# Patient Record
Sex: Female | Born: 1990 | Race: Black or African American | Hispanic: No | Smoking: Never smoker
Health system: Southern US, Community
[De-identification: ages and names within clinical notes are randomized; demographics above are authoritative.]

## PROBLEM LIST (undated history)

## (undated) DIAGNOSIS — D649 Anemia, unspecified: Secondary | ICD-10-CM

## (undated) DIAGNOSIS — N920 Excessive and frequent menstruation with regular cycle: Secondary | ICD-10-CM

## (undated) DIAGNOSIS — O020 Blighted ovum and nonhydatidiform mole: Secondary | ICD-10-CM

## (undated) DIAGNOSIS — E010 Iodine-deficiency related diffuse (endemic) goiter: Secondary | ICD-10-CM

## (undated) HISTORY — DX: Blighted ovum and nonhydatidiform mole: O02.0

## (undated) HISTORY — DX: Excessive and frequent menstruation with regular cycle: N92.0

## (undated) HISTORY — DX: Iodine-deficiency related diffuse (endemic) goiter: E01.0

---
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
## 2010-06-10 NOTE — Op Note (Signed)
Sharon Pratt, Sharon Pratt                 ACCOUNT NO.:  [id number]   MEDICAL RECORD NO.:  [id number]          PATIENT TYPE:  INP   LOCATION:  9372                          [facility]:  [facility]   PHYSICIAN:  Hal Morales, M.D.DATE OF BIRTH:  03-Feb-1990   DATE OF PROCEDURE:  10/22/2006  DATE OF DISCHARGE:                               OPERATIVE REPORT   PREOPERATIVE DIAGNOSES:  1. Intrauterine pregnancy at 78 weeks' gestation.  2. Severe preeclampsia with liver enzyme abnormalities.   POSTOPERATIVE DIAGNOSES:  1. Intrauterine pregnancy at 70 weeks' gestation.  2. Severe preeclampsia with liver enzyme abnormalities.   OPERATION:  Primary low transverse cesarean section.   SURGEON:  Dr. Dierdre Forth.   FIRST ASSISTANT:  Wynelle Bourgeois, certified nurse midwife.   ANESTHESIA:  Spinal.   ESTIMATED BLOOD LOSS:  750 mL.   COMPLICATIONS:  None.   FINDINGS:  The uterus, tubes and ovaries were normal for the gravid  state.  The placenta contained an eccentrically inserted three-vessel  cord.  The placenta sheared off the uterus very easily with the question  of abruption.  The patient was delivered of a female infant whose name  is Jerrel Ivory weighing 795 grams with Apgars of 5 and 7 at one and five  minutes, respectively.  The arterial pH was 7.27.   PREOPERATIVE DISCUSSION:  The patient began having increasingly severe  right flank pain over the evening.  This was initially noted at  approximately 5:00 p.m. at which time blood pressures were in the  150s/90 range.  Laboratory studies performed as that time showed a  platelet count of 364,000 and SGOT of 59 and SGPT of 72.  These were  slightly increased from earlier in the day.  At approximately 11:30  p.m., the patient began having more severe right flank pain and did not  get adequate relief from pain medication.  Repeat laboratory studies  included a platelet count of 277,000, LDH of 610, SGOT of 306 and SGPT  of 283.  On  examination, the patient was noted to have right upper  quadrant tenderness.  The fetal heart rate was stable with occasional  decelerations.  A discussion was held with the parents concerning the  rapid deterioration of the mother's liver enzymes moving towards a  possible diagnosis of HELLP syndrome, and the recommendation was made  for immediate cesarean section.  A discussion was held concerning the  risks of anesthesia, bleeding, infection, damage to adjacent organs and  other complications that could not be anticipated versus the benefits of  immediate delivery for the infant with the expectation of improvement in  the mother's preeclampsia.  They consented to proceed.   PROCEDURE:  The patient was taken to the operating room after  appropriate identification and placed on the operating table.  After  placement of a spinal anesthetic, she was placed in the supine position  with a left lateral tilt.  The abdomen and perineum were prepped with  multiple layers of Betadine and a Foley catheter inserted into the  bladder and connected to straight drainage.  The abdomen was  draped as a  sterile field.  After assurance of adequate anesthesia, a transverse  incision was made in the abdomen and the abdomen opened in layers.  The  peritoneum was entered and the bladder blade placed.  The uterus was  incised approximately 2 cm above the uterovesical fold and that incision  taken laterally on either side bluntly.  The membranes were ruptured  with the egress of clear fluid.  The infant was delivered from the  occiput transverse position and, after having the nares and pharynx  suctioned, cord clamped and cut, was handed off to the awaiting  pediatricians.  The placenta was noted to separate very easily, raising  the concern for partial abruption.  The uterine cavity was cleared of  products of conception and the uterine incision closed with a running  interlocking suture of 0 Vicryl.  An  imbricating suture of 0 Vicryl was  then placed and the bladder flap repaired with a running suture of 2-0  Vicryl.  Copious irrigation was carried out and hemostasis noted to be  adequate.  The abdominal peritoneum was closed with a running suture of  2-0 Vicryl.  The rectus muscles were reapproximated in midline with a  figure-of-eight suture of 2-0 Vicryl.  The rectus fascia was closed with  running suture of 0 Vicryl and then reinforced on either side of midline  with figure-of-eight sutures of 0 Vicryl.  The subcutaneous tissue was  made hemostatic with Bovie cautery and irrigated.  A subcuticular suture  of 3-0 Monocryl was then placed and Steri-Strips placed across the  incision.  A sterile dressing was applied and the patient taken from the  operating room to the recovery room in satisfactory condition, having  tolerated the procedure well with sponge and instrument counts correct.  The infant went to the neonatal intensive care unit and the placenta  went to pathology.      Hal Morales, M.D.  Electronically Signed     VPH/MEDQ  D:  10/22/2006  T:  10/22/2006  Job:  [id number]

## 2010-06-13 NOTE — Discharge Summary (Signed)
NAMEHERMAN, Sharon Pratt                 ACCOUNT NO.:  [id number]   MEDICAL RECORD NO.:  [id number]          PATIENT TYPE:  INP   LOCATION:  9372                          [facility]   PHYSICIAN:  Janine Limbo, M.D.DATE OF BIRTH:  11-17-1990   DATE OF ADMISSION:  10/19/2006  DATE OF DISCHARGE:  10/25/2006                               DISCHARGE SUMMARY   ADMISSION DIAGNOSES:  1. Intrauterine pregnancy at 27-1/7 weeks.  2. Severe preeclampsia.   DISCHARGE DIAGNOSES:  1. Intrauterine pregnancy at 27 weeks.  2. Severe preeclampsia with liver enzyme abnormalities  3. Neonatal Intensive Care Unit infant.   PROCEDURES:  1. Magnesium sulfate therapy.  2. Betamethasone therapy.  3. Primary low transverse cesarean section.  4. Spinal anesthesia.   HOSPITAL COURSE:  [name] is an 20 year old gravida 1, para 0 at 27-1/7  weeks who was admitted on October 19, 2006 secondary to elevated blood  pressure and 3+ protein in her urine as well as 24-hour urine showing  9954 mg of protein.  Pregnancy had been remarkable for (1) first  trimester spotting, (2) first trimester Chlamydia.  At the time of  admission blood pressures were in the 130-170s/70s-90s.  Her SGOT and  SGPT were 71 and 77 respectively.  Her platelet count was within normal  limits.  Her BUN and creatinine were essentially normal.  She was placed  on magnesium sulfate.  She had an estimated fetal weight at the 59th  percentile of 2 pounds and normal fluid.  She denied any other symptoms.  The decision was made to proceed with betamethasone therapy.  Maternal  fetal medicine was consulted and the decision was made to proceed with  delivery should her liver function tests increase from that point.  Liver functions were repeated on September 24 with values stable,  pressures remained elevated.  Betamethasone course was completed on  September 26.  Early in the morning she was having some right flank  pain.  Blood pressures  were 150s-180s/90-108 despite IV labetalol.  SGOT  was greater than 300, SGPT was greater than 200.  LDH was greater than  600.  Fetal heart rate was stable with occasional decelerations.  In  light of the patient's deterioration, the patient was consented for  cesarean section.  She was taken to the operating room where a primary  low transverse cesarean section was performed by Dr. Pennie Rushing under  spinal anesthesia.  Findings were a viable female by the name of  Sharon Pratt, weight 795 grams, Apgars were 5 and 7.  Cord pH was 7.27.  The infant was taken to the NICU.  The patient was taken to recovery and  then to [facility] for recovery.  By postop day #1 her pressures were 142-  171/85-105, SGOT and SGPT were 147 and 206, which were down from 292 and  242 respectively.  Her hemoglobin was 11.3, platelet count was 230,000.  Magnesium sulfate was continued until September 28.  Labs were evaluated  again on the morning of September 28 showing SGOT of 59, SGPT of 119,  hemoglobin was 9.1 and  platelet count was 229.  Magnesium sulfate was  discontinued.  Her pressures were still 140s-160s/80-105.  By day 3  postop pressures were still 140s-160s/90-108.  The patient had lost 7  pounds.  Her output was excellent.  Her physical exam was within normal  limits.  The decision was made to discharge her home in light of her  improving preeclampsia.  She was deemed to have received full benefit of  her hospital stay and was discharged home by Dr. Stefano Gaul.   DISCHARGE INSTRUCTIONS:  Per [facility] handout.  PIH  precautions were also reviewed with the patient.   DISCHARGE MEDICATIONS:  1. Labetalol 100 mg p.o. b.i.d. was given.  2. Tylox 1-2 p.o. q.3-4 h p.r.n. pain.  3. Motrin 600 mg 1 p.o. q.6 h p.r.n. pain.   DISCHARGE FOLLOW-UP:  Was going to occur on the following Thursday for  blood pressure check by [employer] Nurse and then she was to return to  the office in 2 weeks for blood pressure  examination, and then to follow  p.r.n. based upon those findings.      Sharon Pratt, C.N.M.      Janine Limbo, M.D.  Electronically Signed    VLL/MEDQ  D:  11/16/2006  T:  11/17/2006  Job:  [id number]

## 2010-11-06 LAB — CBC
HCT: 26.8 — ABNORMAL LOW
HCT: 33.6 — ABNORMAL LOW
HCT: 34 — ABNORMAL LOW
HCT: 34.5 — ABNORMAL LOW
HCT: 34.6 — ABNORMAL LOW
HCT: 36.3
HCT: 38.2
Hemoglobin: 10.9 — ABNORMAL LOW
Hemoglobin: 11.3 — ABNORMAL LOW
Hemoglobin: 11.4 — ABNORMAL LOW
Hemoglobin: 11.9 — ABNORMAL LOW
Hemoglobin: 12.3
Hemoglobin: 12.9
Hemoglobin: 9.1 — ABNORMAL LOW
MCHC: 33.3
MCHC: 33.5
MCHC: 33.5
MCHC: 33.8
MCHC: 33.9
MCV: 82.5
MCV: 83.4
Platelets: 277
Platelets: 308
Platelets: 318
Platelets: 342 — ABNORMAL HIGH
RBC: 3.88
RBC: 4.15
RBC: 4.21
RBC: 4.34
RBC: 4.63
RDW: 14.1 — ABNORMAL HIGH
RDW: 14.2 — ABNORMAL HIGH
RDW: 14.2 — ABNORMAL HIGH
RDW: 14.2 — ABNORMAL HIGH
RDW: 14.5 — ABNORMAL HIGH
RDW: 14.6 — ABNORMAL HIGH
WBC: 11.5 — ABNORMAL HIGH
WBC: 16.9 — ABNORMAL HIGH
WBC: 24.6 — ABNORMAL HIGH
WBC: 29.5 — ABNORMAL HIGH
WBC: 9.5

## 2010-11-06 LAB — COMPREHENSIVE METABOLIC PANEL
ALT: 242 — ABNORMAL HIGH
ALT: 58 — ABNORMAL HIGH
ALT: 70 — ABNORMAL HIGH
ALT: 72 — ABNORMAL HIGH
AST: 44 — ABNORMAL HIGH
AST: 51 — ABNORMAL HIGH
AST: 65 — ABNORMAL HIGH
AST: 71 — ABNORMAL HIGH
Albumin: 1.5 — ABNORMAL LOW
Albumin: 2 — ABNORMAL LOW
Albumin: 2.1 — ABNORMAL LOW
Albumin: 2.7 — ABNORMAL LOW
Alkaline Phosphatase: 70
Alkaline Phosphatase: 79
Alkaline Phosphatase: 80
Alkaline Phosphatase: 81
Alkaline Phosphatase: 82
Alkaline Phosphatase: 96
Alkaline Phosphatase: 98
BUN: 10
BUN: 11
BUN: 7
BUN: 7
BUN: 8
BUN: 9
CO2: 22
CO2: 22
CO2: 23
Calcium: 7 — ABNORMAL LOW
Calcium: 7.1 — ABNORMAL LOW
Calcium: 7.2 — ABNORMAL LOW
Calcium: 7.9 — ABNORMAL LOW
Chloride: 104
Chloride: 104
Chloride: 106
Chloride: 109
Creatinine, Ser: 0.71
Creatinine, Ser: 0.73
Creatinine, Ser: 0.73
GFR calc Af Amer: >60
GFR calc Af Amer: >60
GFR calc Af Amer: >60
GFR calc non Af Amer: >60
GFR calc non Af Amer: >60
GFR calc non Af Amer: >60
Glucose, Bld: 106 — ABNORMAL HIGH
Glucose, Bld: 107 — ABNORMAL HIGH
Glucose, Bld: 113 — ABNORMAL HIGH
Glucose, Bld: 124 — ABNORMAL HIGH
Glucose, Bld: 133 — ABNORMAL HIGH
Glucose, Bld: 89
Potassium: 3.8
Potassium: 4
Potassium: 4.1
Potassium: 4.1
Potassium: 4.2
Sodium: 134 — ABNORMAL LOW
Sodium: 134 — ABNORMAL LOW
Sodium: 136
Sodium: 137
Total Bilirubin: 0.3
Total Bilirubin: 0.6
Total Bilirubin: 0.6
Total Protein: 4.2 — ABNORMAL LOW
Total Protein: 4.5 — ABNORMAL LOW
Total Protein: 4.7 — ABNORMAL LOW
Total Protein: 4.9 — ABNORMAL LOW
Total Protein: 5 — ABNORMAL LOW
Total Protein: 6

## 2010-11-06 LAB — MAGNESIUM
Magnesium: 4.4 — ABNORMAL HIGH
Magnesium: 4.5 — ABNORMAL HIGH
Magnesium: 5 — ABNORMAL HIGH

## 2010-11-06 LAB — TYPE AND SCREEN
ABO/RH(D): B POS
Antibody Screen: NEGATIVE

## 2010-11-06 LAB — ANA: Anti Nuclear Antibody(ANA): NEGATIVE

## 2010-11-06 LAB — URIC ACID
Uric Acid, Serum: 5.5
Uric Acid, Serum: 5.7
Uric Acid, Serum: 5.9
Uric Acid, Serum: 6.2

## 2010-11-06 LAB — LACTATE DEHYDROGENASE
LDH: 163
LDH: 212
LDH: 610 — ABNORMAL HIGH

## 2010-11-06 LAB — LUPUS ANTICOAGULANT PANEL: Lupus Anticoagulant: NOT DETECTED

## 2010-11-06 LAB — C3 COMPLEMENT: C3 Complement: 139

## 2010-11-06 LAB — URINALYSIS, ROUTINE W REFLEX MICROSCOPIC
Bilirubin Urine: NEGATIVE
Nitrite: NEGATIVE
Protein, ur: >300 — AB
Specific Gravity, Urine: >1.03 — ABNORMAL HIGH
Urobilinogen, UA: 0.2

## 2010-11-06 LAB — DIFFERENTIAL
Basophils Relative: 0
Eosinophils Absolute: 0
Monocytes Absolute: 1.5 — ABNORMAL HIGH
Monocytes Relative: 9
Neutrophils Relative %: 88 — ABNORMAL HIGH

## 2010-11-06 LAB — RPR: RPR Ser Ql: NONREACTIVE

## 2011-01-27 HISTORY — PX: DILATION AND CURETTAGE OF UTERUS: SHX78

## 2015-12-20 ENCOUNTER — Emergency Department (HOSPITAL_COMMUNITY)
Admission: EM | Admit: 2015-12-20 | Discharge: 2015-12-20 | Disposition: A | Discharge status: Home / Self Care | Payer: Self-pay | Attending: Emergency Medicine | Admitting: Emergency Medicine

## 2015-12-20 ENCOUNTER — Encounter (HOSPITAL_COMMUNITY): Payer: Self-pay

## 2015-12-20 DIAGNOSIS — Z5321 Procedure and treatment not carried out due to patient leaving prior to being seen by health care provider: Secondary | ICD-10-CM | POA: Insufficient documentation

## 2015-12-20 DIAGNOSIS — R339 Retention of urine, unspecified: Secondary | ICD-10-CM | POA: Insufficient documentation

## 2015-12-20 DIAGNOSIS — M549 Dorsalgia, unspecified: Secondary | ICD-10-CM | POA: Insufficient documentation

## 2015-12-20 LAB — URINALYSIS, ROUTINE W REFLEX MICROSCOPIC
BILIRUBIN URINE: NEGATIVE
Glucose, UA: NEGATIVE mg/dL
KETONES UR: NEGATIVE mg/dL
LEUKOCYTES UA: NEGATIVE
NITRITE: NEGATIVE
PROTEIN: NEGATIVE mg/dL
Specific Gravity, Urine: 1.035 — ABNORMAL HIGH (ref 1.005–1.030)
pH: 6 (ref 5.0–8.0)

## 2015-12-20 LAB — URINE MICROSCOPIC-ADD ON: WBC UA: NONE SEEN WBC/hpf (ref 0–5)

## 2015-12-20 NOTE — ED Triage Notes (Signed)
Pt states she started having lower back pain with inability to fully urinated; pt denies pain on urination;Pt states pain at 6/10 on arrival. Pt a&ox 4

## 2015-12-20 NOTE — ED Notes (Signed)
Pt came to desk and stated she needed to leave.  Pt states she needs to go to work.  This RN apologized and encouraged patient to come back with return precautions.

## 2016-01-07 DIAGNOSIS — N946 Dysmenorrhea, unspecified: Secondary | ICD-10-CM | POA: Insufficient documentation

## 2016-01-07 DIAGNOSIS — G43829 Menstrual migraine, not intractable, without status migrainosus: Secondary | ICD-10-CM | POA: Insufficient documentation

## 2016-01-15 ENCOUNTER — Other Ambulatory Visit: Payer: Self-pay | Admitting: Obstetrics and Gynecology

## 2016-01-15 DIAGNOSIS — E049 Nontoxic goiter, unspecified: Secondary | ICD-10-CM

## 2016-01-17 ENCOUNTER — Other Ambulatory Visit: Payer: Self-pay

## 2016-01-21 ENCOUNTER — Ambulatory Visit
Admission: RE | Admit: 2016-01-21 | Discharge: 2016-01-21 | Disposition: A | Discharge status: Home / Self Care | Source: Ambulatory Visit | Attending: Obstetrics and Gynecology | Admitting: Obstetrics and Gynecology

## 2016-01-21 DIAGNOSIS — E049 Nontoxic goiter, unspecified: Secondary | ICD-10-CM

## 2017-02-11 LAB — HM HEPATITIS C SCREENING LAB: HM Hepatitis Screen: NEGATIVE

## 2017-10-19 ENCOUNTER — Encounter (HOSPITAL_COMMUNITY): Payer: Self-pay

## 2017-10-19 ENCOUNTER — Other Ambulatory Visit: Payer: Self-pay

## 2017-10-19 ENCOUNTER — Ambulatory Visit (INDEPENDENT_AMBULATORY_CARE_PROVIDER_SITE_OTHER): Payer: Worker's Compensation

## 2017-10-19 ENCOUNTER — Ambulatory Visit (HOSPITAL_COMMUNITY)
Admission: EM | Admit: 2017-10-19 | Discharge: 2017-10-19 | Disposition: A | Discharge status: Home / Self Care | Payer: Worker's Compensation | Attending: Physician Assistant | Admitting: Physician Assistant

## 2017-10-19 DIAGNOSIS — S9032XA Contusion of left foot, initial encounter: Secondary | ICD-10-CM

## 2017-10-19 NOTE — Discharge Instructions (Addendum)
Tylenol or ibuprofen for pain.  Return if any problems.  

## 2017-10-19 NOTE — ED Triage Notes (Signed)
Pt states she was working on Friday and the teller locker door fell and landed on her left foot.

## 2017-10-19 NOTE — ED Provider Notes (Signed)
[place]    CSN: [id number] Arrival date & time: 10/19/17  1922     History   Chief Complaint Chief Complaint  Patient presents with  . Foot Injury    HPI Sharon Pratt is a 27 y.o. female.   The history is provided by the patient. No language interpreter was used.  Foot Injury  Location:  Foot Time since incident:  2 days Injury: yes   Mechanism of injury: crush   Crush:    Mechanism:  Falling object Foot location:  L foot Pain details:    Quality:  Aching   Severity:  Mild   Onset quality:  Gradual   Timing:  Constant Chronicity:  New Prior injury to area:  No Relieved by:  Nothing Ineffective treatments:  None tried   History reviewed. No pertinent past medical history.  There are no active problems to display for this patient.   History reviewed. No pertinent surgical history.  OB History   None      Home Medications    Prior to Admission medications   Not on File    Family History History reviewed. No pertinent family history.  Social History Social History   Tobacco Use  . Smoking status: Unknown If Ever Smoked  Substance Use Topics  . Alcohol use: Yes  . Drug use: Not on file     Allergies   Patient has no allergy information on record.   Review of Systems Review of Systems  Skin: Positive for color change.  All other systems reviewed and are negative.    Physical Exam Triage Vital Signs ED Triage Vitals  Enc Vitals Group     BP 10/19/17 1952 124/78     Pulse Rate 10/19/17 1952 73     Resp 10/19/17 1952 16     Temp 10/19/17 1952 (!) 97.3 F (36.3 C)     Temp Source 10/19/17 1952 Oral     SpO2 10/19/17 1952 100 %     Weight 10/19/17 1950 165 lb (74.8 kg)     Height --      Head Circumference --      Peak Flow --      Pain Score --      Pain Loc --      Pain Edu? --      Excl. in [place]? --    No data found.  Updated Vital Signs BP 124/78 (BP Location: Right Arm)   Pulse 73   Temp (!) 97.3 F  (36.3 C) (Oral)   Resp 16   Wt 165 lb (74.8 kg)   LMP 10/08/2017   SpO2 100%   Visual Acuity Right Eye Distance:   Left Eye Distance:   Bilateral Distance:    Right Eye Near:   Left Eye Near:    Bilateral Near:     Physical Exam  Constitutional: She appears well-developed and well-nourished.  Musculoskeletal: Normal range of motion. She exhibits tenderness.  Tender left foot, bruised mid foot.  Normal pulse,  Normal range of motion  Neurological: She is alert.  Skin: Skin is warm.  Psychiatric: She has a normal mood and affect.  Nursing note and vitals reviewed.    UC Treatments / Results  Labs (all labs ordered are listed, but only abnormal results are displayed) Labs Reviewed - No data to display  EKG None  Radiology Dg Foot Complete Left  Result Date: 10/19/2017 CLINICAL DATA:  Left foot pain after injury last week. EXAM:  LEFT FOOT - COMPLETE 3+ VIEW COMPARISON:  None. FINDINGS: There is no evidence of fracture or dislocation. There is no evidence of arthropathy or other focal bone abnormality. Soft tissues are unremarkable. IMPRESSION: Normal left foot. Electronically Signed   By: Marijo Conception, M.D.   On: 10/19/2017 20:21    Procedures Procedures (including critical care time)  Medications Ordered in UC Medications - No data to display  Initial Impression / Assessment and Plan / UC Course  I have reviewed the triage vital signs and the nursing notes.  Pertinent labs & imaging results that were available during my care of the patient were reviewed by me and considered in my medical decision making (see chart for details).     MDM  Pt counseled on bruising.  Pt declined ace wrap.   Pt advised tylenol.  Pt given work note to work seated.  Final Clinical Impressions(s) / UC Diagnoses   Final diagnoses:  Contusion of left foot, initial encounter     Discharge Instructions     Tylenol or ibuprofen for pain.   Return if any problems.    ED  Prescriptions    None     Controlled Substance Prescriptions [place] Controlled Substance Registry consulted? Not Applicable  An After Visit Summary was printed and given to the patient.    Fransico Meadow, [place] 10/19/17 2044

## 2018-03-07 IMAGING — US US THYROID
1 series · 14 of 25 positions shown · non-contrast
Comparison: None.

CLINICAL DATA: Thyromegaly

EXAM:
THYROID ULTRASOUND
TECHNIQUE: Ultrasound examination of the thyroid gland and adjacent soft
tissues was performed.

[Series 1: us thyroid · 0.08mm/px · 14 of 36 slices shown]
[im 1/36]
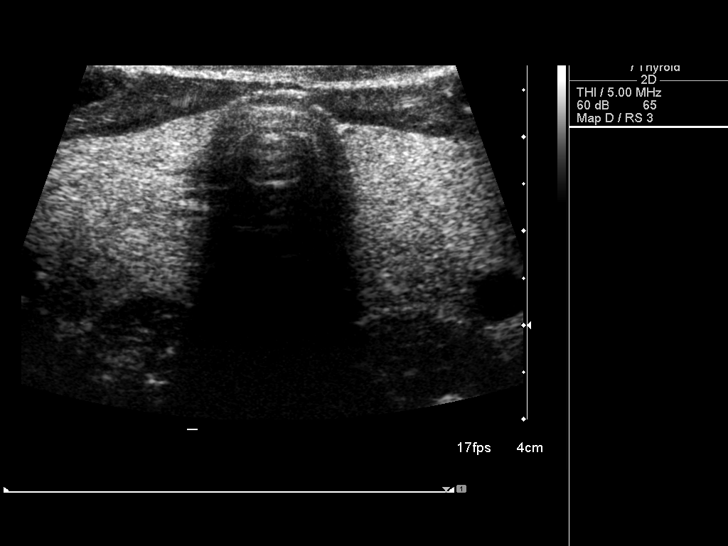
[im 3/36]
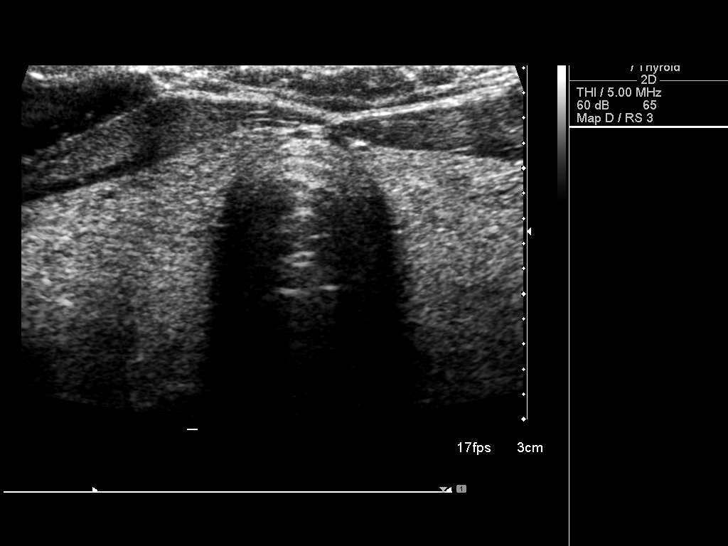
[im 6/36]
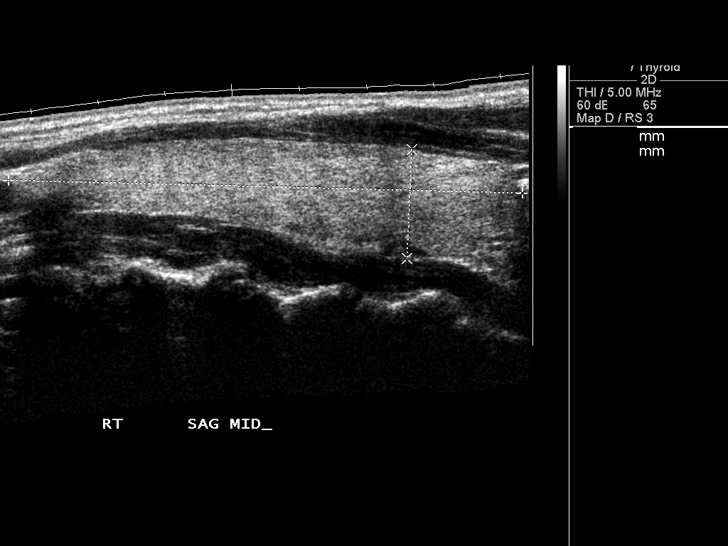
[im 9/36]
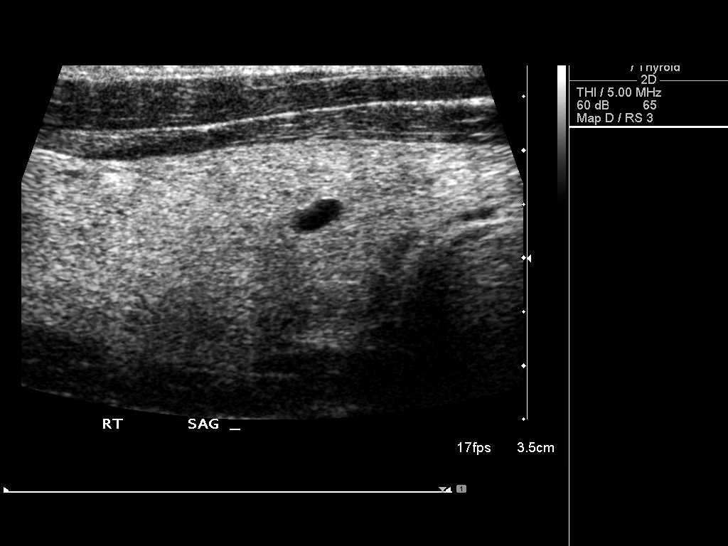
[im 12/36]
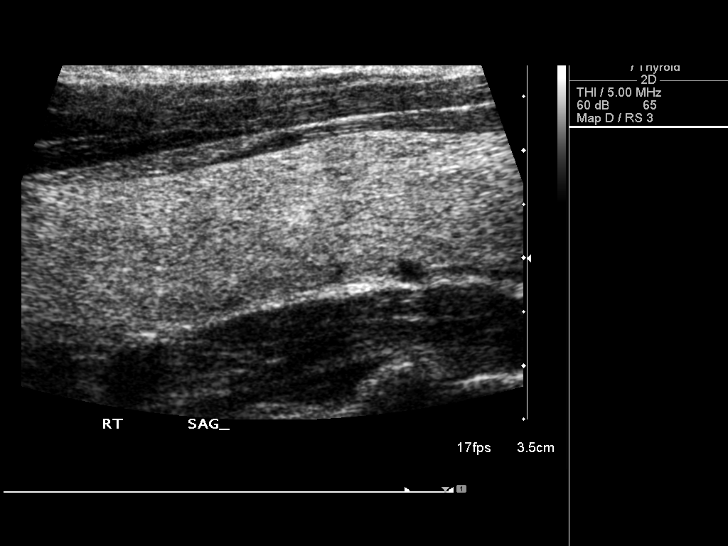
[im 14/36]
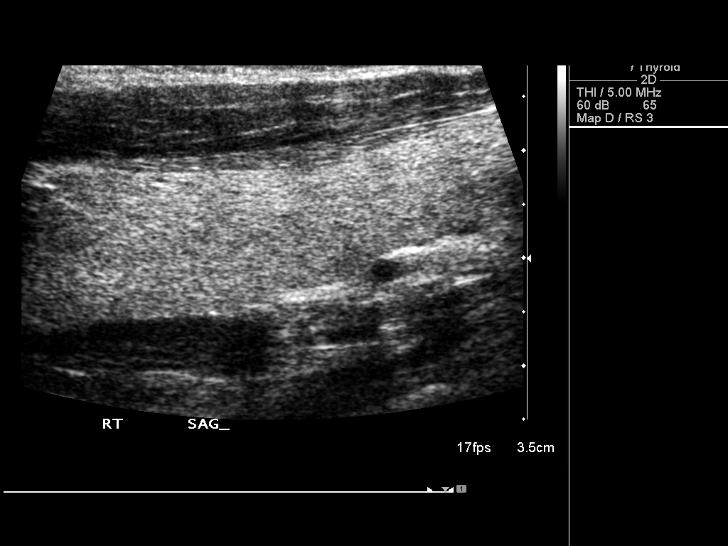
[im 17/36]
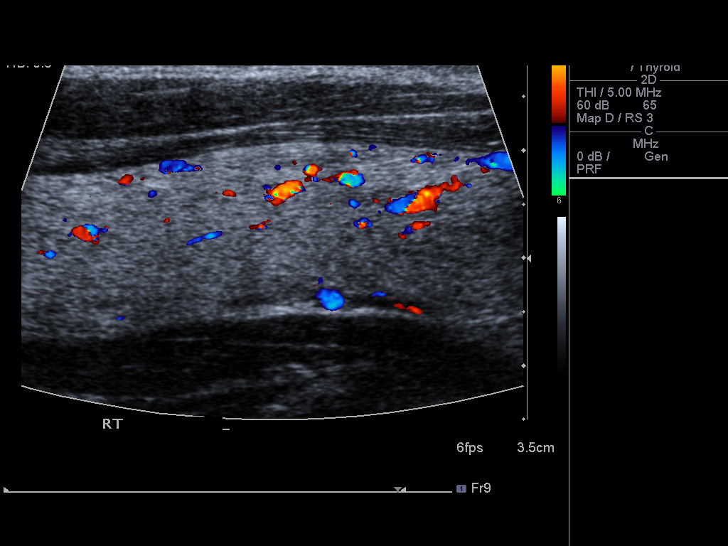
[im 19/36]
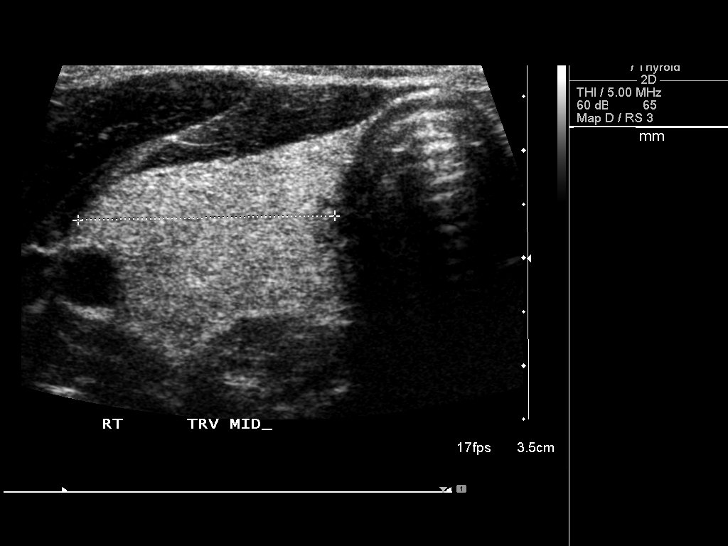
[im 22/36]
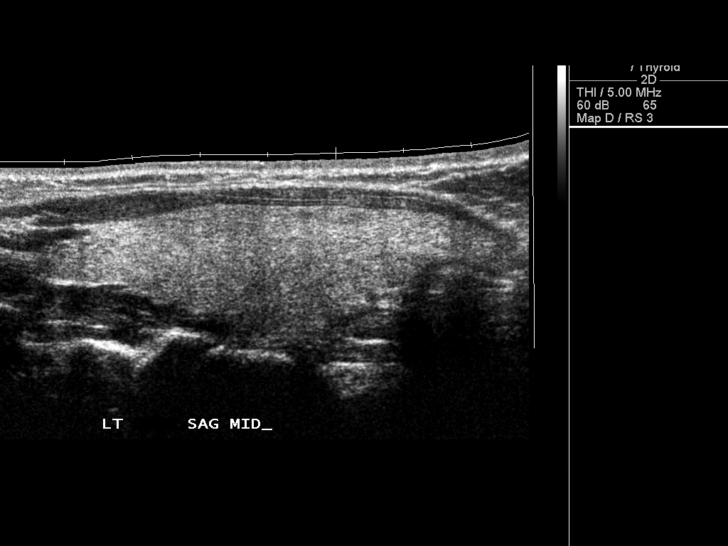
[im 24/36]
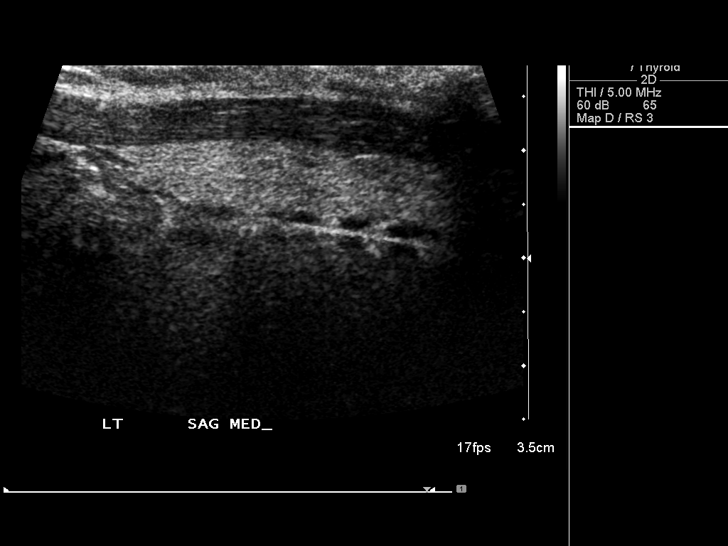
[im 27/36]
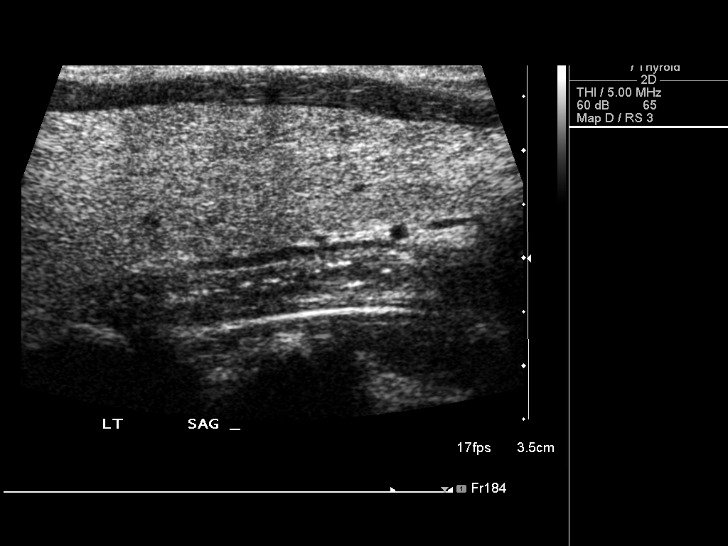
[im 30/36]
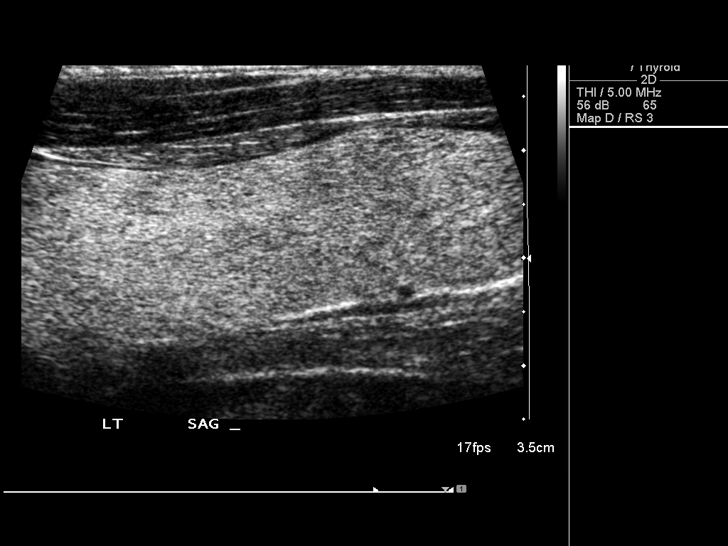
[im 33/36]
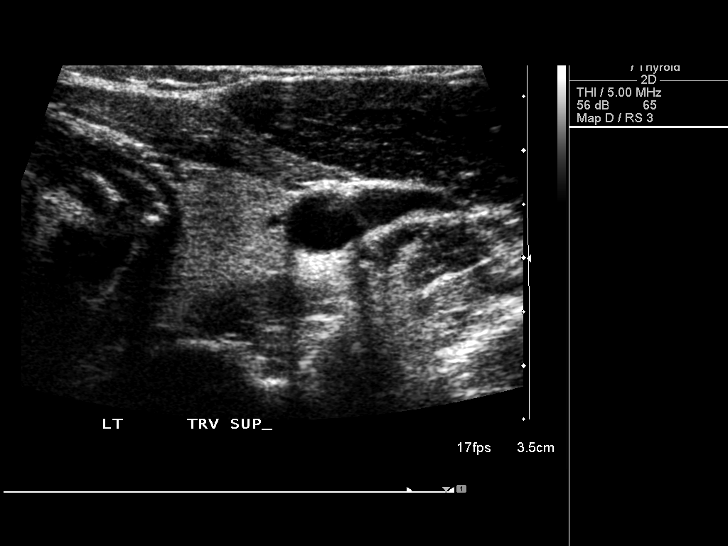
[im 36/36]
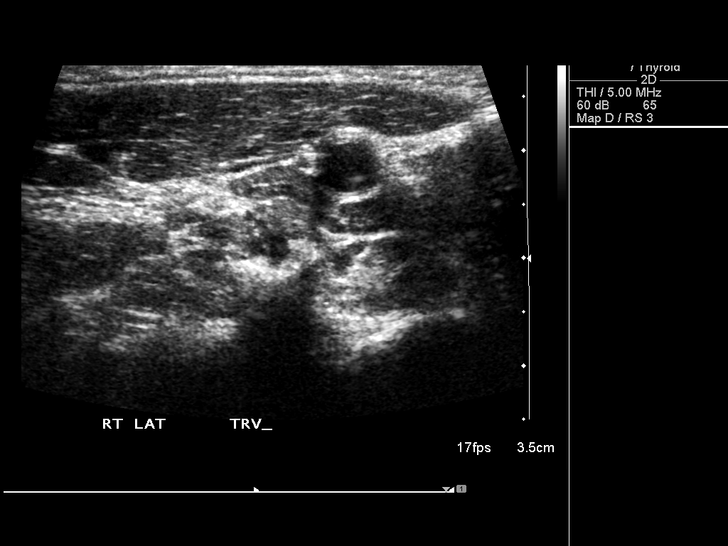

[14 of 25 positions shown; findings below may reference images not displayed]

FINDINGS: Parenchymal Echotexture: Mildly heterogenous

Estimated total number of nodules >/= 1 cm: 0

Number of spongiform nodules >/=  2 cm not described below (TR1): 0

Number of mixed cystic and solid nodules >/= 1.5 cm not described
below (TR2): 0

_________________________________________________________

Isthmus: 0.3 cm thickness

No discrete nodules are identified within the thyroid isthmus.

_________________________________________________________

Right lobe: 7.6 x 1.6 x 2.4 cm

5 mm cyst, mid lobe. No discrete nodules are identified within the
right lobe of the thyroid.

_________________________________________________________

Left lobe: 7.2 x 2.2 x 2.3 cm

No discrete nodules are identified within the left lobe of the
thyroid.
IMPRESSION: 1. Thyromegaly without nodule or mass.

The above is in keeping with the ACR TI-RADS recommendations - [HOSPITAL] 1452;[DATE].

## 2018-12-06 DIAGNOSIS — E049 Nontoxic goiter, unspecified: Secondary | ICD-10-CM | POA: Insufficient documentation

## 2018-12-06 LAB — HM HIV SCREENING LAB: HM HIV Screening: NEGATIVE

## 2018-12-16 DIAGNOSIS — B977 Papillomavirus as the cause of diseases classified elsewhere: Secondary | ICD-10-CM | POA: Insufficient documentation

## 2019-01-11 ENCOUNTER — Other Ambulatory Visit: Payer: Self-pay

## 2019-01-11 ENCOUNTER — Ambulatory Visit: Admission: EM | Admit: 2019-01-11 | Discharge: 2019-01-11 | Disposition: A | Discharge status: Home / Self Care

## 2019-01-11 DIAGNOSIS — Z20828 Contact with and (suspected) exposure to other viral communicable diseases: Secondary | ICD-10-CM

## 2019-01-11 DIAGNOSIS — Z20822 Contact with and (suspected) exposure to covid-19: Secondary | ICD-10-CM

## 2019-01-11 MED ORDER — BENZONATATE 100 MG PO CAPS: 100.0000 mg | ORAL_CAPSULE | Freq: Three times a day (TID) | ORAL | 0 refills | Status: DC

## 2019-01-11 NOTE — ED Provider Notes (Signed)
[place]   [id number] 01/11/19 Arrival Time: [id number]   CC: COVID symptoms  SUBJECTIVE: History from: patient.  Sharon Pratt is a 27 y.o. female who presents with slight dry cough x 2 days.  Denies sick exposure to COVID, flu or strep. However, does admit to coworker with LAD and URI symptoms.  COVID test pending.  Denies recent travel.  Has tried OTC tea with relief.  Denies aggravating factors.  Reports previous symptoms in the past.   Denies fever, chills, fatigue, nasal congestion, rhinorrhea, sore throat, SOB, wheezing, chest pain, nausea, vomiting, changes in bowel or bladder habits.    ROS: As per HPI.  All other pertinent ROS negative.     History reviewed. No pertinent past medical history. History reviewed. No pertinent surgical history. No Known Allergies No current facility-administered medications on file prior to encounter.   Current Outpatient Medications on File Prior to Encounter  Medication Sig Dispense Refill  . loratadine (CLARITIN) 10 MG tablet Take 10 mg by mouth daily.     Social History   Socioeconomic History  . Marital status: Single    Spouse name: Not on file  . Number of children: Not on file  . Years of education: Not on file  . Highest education level: Not on file  Occupational History  . Not on file  Tobacco Use  . Smoking status: Never Smoker  . Smokeless tobacco: Never Used  Substance and Sexual Activity  . Alcohol use: Yes  . Drug use: Not on file  . Sexual activity: Not on file  Other Topics Concern  . Not on file  Social History Narrative  . Not on file   Social Determinants of Health   Financial Resource Strain:   . Difficulty of Paying Living Expenses: Not on file  Food Insecurity:   . Worried About Charity fundraiser in the Last Year: Not on file  . Ran Out of Food in the Last Year: Not on file  Transportation Needs:   . Lack of Transportation (Medical): Not on file  . Lack of Transportation (Non-Medical): Not  on file  Physical Activity:   . Days of Exercise per Week: Not on file  . Minutes of Exercise per Session: Not on file  Stress:   . Feeling of Stress : Not on file  Social Connections:   . Frequency of Communication with Friends and Family: Not on file  . Frequency of Social Gatherings with Friends and Family: Not on file  . Attends Religious Services: Not on file  . Active Member of Clubs or Organizations: Not on file  . Attends Archivist Meetings: Not on file  . Marital Status: Not on file  Intimate Partner Violence:   . Fear of Current or Ex-Partner: Not on file  . Emotionally Abused: Not on file  . Physically Abused: Not on file  . Sexually Abused: Not on file   Family History  Problem Relation Age of Onset  . Healthy Mother   . Healthy Father     OBJECTIVE:  Vitals:   01/11/19 1431  BP: 128/82  Pulse: 71  Resp: 16  Temp: 98.8 F (37.1 C)  TempSrc: Oral  SpO2: 97%     General appearance: alert; well-appearing, nontoxic; speaking in full sentences and tolerating own secretions HEENT: NCAT; Ears: EACs clear, TMs pearly gray; Eyes: PERRL.  EOM grossly intact. Nose: nares patent without rhinorrhea, Throat: oropharynx clear, tonsils non erythematous or enlarged, uvula midline  Neck:  supple without LAD Lungs: unlabored respirations, symmetrical air entry; cough: absent; no respiratory distress; CTAB Heart: regular rate and rhythm.   Skin: warm and dry Psychological: alert and cooperative; normal mood and affect  ASSESSMENT & PLAN:  1. Suspected COVID-19 virus infection   2. Encounter for laboratory testing for COVID-19 virus     Meds ordered this encounter  Medications  . benzonatate (TESSALON) 100 MG capsule    Sig: Take 1 capsule (100 mg total) by mouth every 8 (eight) hours.    Dispense:  21 capsule    Refill:  0    Order Specific Question:   Supervising Provider    Answer:   Raylene Everts [id number]    COVID testing ordered.  It will take  between 5-7 days for test results.  Someone will contact you regarding abnormal results.    In the meantime: You should remain isolated in your home for 10 days from symptom onset AND greater than 72 hours after symptoms resolution (absence of fever without the use of fever-reducing medication and improvement in respiratory symptoms), whichever is longer Get plenty of rest and push fluids Tessalon Perles prescribed for cough Use OTC zyrtec for nasal congestion, runny nose, and/or sore throat Use OTC flonase for nasal congestion and runny nose Use medications daily for symptom relief Use OTC medications like ibuprofen or tylenol as needed fever or pain Call or go to the ED if you have any new or worsening symptoms such as fever, worsening cough, shortness of breath, chest tightness, chest pain, turning blue, changes in mental status, etc...   Reviewed expectations re: course of current medical issues. Questions answered. Outlined signs and symptoms indicating need for more acute intervention. Patient verbalized understanding. After Visit Summary given.         Lestine Box, PA-C 01/11/19 1458

## 2019-01-11 NOTE — ED Triage Notes (Signed)
Pt presents to UC stating she would like a covid test. Pt states she has a slight cough x2 days.

## 2019-01-11 NOTE — Discharge Instructions (Signed)

## 2019-01-12 ENCOUNTER — Other Ambulatory Visit: Payer: Self-pay | Admitting: Obstetrics & Gynecology

## 2019-01-13 LAB — NOVEL CORONAVIRUS, NAA: SARS-CoV-2, NAA: NOT DETECTED

## 2019-01-24 ENCOUNTER — Other Ambulatory Visit: Payer: Self-pay

## 2019-01-24 ENCOUNTER — Ambulatory Visit (HOSPITAL_COMMUNITY)
Admission: EM | Admit: 2019-01-24 | Discharge: 2019-01-24 | Disposition: A | Discharge status: Home / Self Care | Attending: Family Medicine | Admitting: Family Medicine

## 2019-01-24 ENCOUNTER — Encounter (HOSPITAL_COMMUNITY): Payer: Self-pay | Admitting: Emergency Medicine

## 2019-01-24 DIAGNOSIS — Z20828 Contact with and (suspected) exposure to other viral communicable diseases: Secondary | ICD-10-CM | POA: Insufficient documentation

## 2019-01-24 DIAGNOSIS — J029 Acute pharyngitis, unspecified: Secondary | ICD-10-CM | POA: Diagnosis not present

## 2019-01-24 NOTE — ED Triage Notes (Signed)
PT has a sore throat since yesterday. A coworker is positive.

## 2019-01-24 NOTE — Discharge Instructions (Addendum)
If your Covid-19 test is positive, you will receive a phone call from [facility] regarding your results. Negative test results are not called. Both positive and negative results area always visible on MyChart. If you do not have a MyChart account, sign up instructions are in your discharge papers.  

## 2019-01-24 NOTE — ED Provider Notes (Signed)
[place]   [id number] 01/24/19 Arrival Time: [place]:  1. Sore throat     COVID-19 testing sent. See work note for self-isolation guidelines.  Follow-up Information    [facility].   Specialty: Urgent Care Why: As needed. Contact information: [address] [place] [phone]          Reviewed expectations re: course of current medical issues. Questions answered. Outlined signs and symptoms indicating need for more acute intervention. Patient verbalized understanding. After Visit Summary given.   SUBJECTIVE: History from: patient. Sharon Pratt is a 28 y.o. female who requests COVID-19 testing. Known COVID-19 contact: co-worker who recently tested +. Recent travel: none. Denies: runny nose, congestion, fever, cough, difficulty breathing and headache. Does reports mild sore throat since yesterday. Normal PO intake without n/v/d.  ROS: As per HPI.   OBJECTIVE:  Vitals:   01/24/19 1220  BP: 134/80  Pulse: (!) 58  Resp: 18  Temp: 98.4 F (36.9 C)  TempSrc: Oral  SpO2: 100%    General appearance: alert; no distress Eyes: PERRLA; EOMI; conjunctiva normal HENT: ; AT; nasal mucosa normal; oral mucosa normal Neck: supple  Lungs: speaks full sentences without difficulty; unlabored Heart: regular rate and rhythm Abdomen: soft, non-tender Extremities: no edema Skin: warm and dry Neurologic: normal gait Psychological: alert and cooperative; normal mood and affect  Labs: Labs Reviewed  NOVEL CORONAVIRUS, NAA (HOSP ORDER, SEND-OUT TO REF LAB; TAT 18-24 HRS)      No Known Allergies   Social History   Socioeconomic History  . Marital status: Married    Spouse name: Not on file  . Number of children: Not on file  . Years of education: Not on file  . Highest education level: Not on file  Occupational History  . Not on file  Tobacco Use  . Smoking status:  Never Smoker  . Smokeless tobacco: Never Used  Substance and Sexual Activity  . Alcohol use: Yes  . Drug use: Not on file  . Sexual activity: Not on file  Other Topics Concern  . Not on file  Social History Narrative  . Not on file   Social Determinants of Health   Financial Resource Strain:   . Difficulty of Paying Living Expenses: Not on file  Food Insecurity:   . Worried About Charity fundraiser in the Last Year: Not on file  . Ran Out of Food in the Last Year: Not on file  Transportation Needs:   . Lack of Transportation (Medical): Not on file  . Lack of Transportation (Non-Medical): Not on file  Physical Activity:   . Days of Exercise per Week: Not on file  . Minutes of Exercise per Session: Not on file  Stress:   . Feeling of Stress : Not on file  Social Connections:   . Frequency of Communication with Friends and Family: Not on file  . Frequency of Social Gatherings with Friends and Family: Not on file  . Attends Religious Services: Not on file  . Active Member of Clubs or Organizations: Not on file  . Attends Archivist Meetings: Not on file  . Marital Status: Not on file  Intimate Partner Violence:   . Fear of Current or Ex-Partner: Not on file  . Emotionally Abused: Not on file  . Physically Abused: Not on file  . Sexually Abused: Not on file   Family History  Problem Relation Age  of Onset  . Healthy Mother   . Healthy Father    History reviewed. No pertinent surgical history.   Vanessa Kick, MD 01/24/19 1357

## 2019-01-26 LAB — NOVEL CORONAVIRUS, NAA (HOSP ORDER, SEND-OUT TO REF LAB; TAT 18-24 HRS): SARS-CoV-2, NAA: NOT DETECTED
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
## 2020-11-02 ENCOUNTER — Other Ambulatory Visit: Payer: Self-pay

## 2020-11-02 ENCOUNTER — Encounter (HOSPITAL_COMMUNITY): Payer: Self-pay | Admitting: Physician Assistant

## 2020-11-02 ENCOUNTER — Ambulatory Visit (HOSPITAL_COMMUNITY)
Admission: EM | Admit: 2020-11-02 | Discharge: 2020-11-02 | Disposition: A | Discharge status: Home / Self Care | Attending: Physician Assistant | Admitting: Physician Assistant

## 2020-11-02 DIAGNOSIS — M546 Pain in thoracic spine: Secondary | ICD-10-CM

## 2020-11-02 MED ORDER — PREDNISONE 20 MG PO TABS: 40.0000 mg | ORAL_TABLET | Freq: Every day | ORAL | 0 refills | Status: AC

## 2020-11-02 MED ORDER — CYCLOBENZAPRINE HCL 10 MG PO TABS: 10.0000 mg | ORAL_TABLET | Freq: Two times a day (BID) | ORAL | 0 refills | Status: DC | PRN

## 2020-11-02 NOTE — Discharge Instructions (Addendum)
Follow up with any further concerns.
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
## 2021-02-03 LAB — RESULTS CONSOLE HPV: CHL HPV: NEGATIVE

## 2021-02-03 LAB — HM PAP SMEAR: HM Pap smear: NORMAL

## 2021-03-07 ENCOUNTER — Other Ambulatory Visit: Payer: Self-pay | Admitting: Obstetrics & Gynecology

## 2021-06-04 ENCOUNTER — Other Ambulatory Visit: Payer: Self-pay | Admitting: Obstetrics & Gynecology

## 2021-08-01 NOTE — Pre-Procedure Instructions (Signed)
Surgical Instructions    Your procedure is scheduled on Monday, July 17th.  Report to [facility] Main Entrance "A" at 7:00 A.M., then check in with the Admitting office.  Call this number if you have problems the morning of surgery:  [phone]   If you have any questions prior to your surgery date call [phone]: Open Monday-Friday 8am-4pm    Remember:  Do not eat or drink after midnight the night before your surgery    Take these medicines the morning of surgery with A SIP OF WATER: NONE  As of today, STOP taking any Aspirin (unless otherwise instructed by your surgeon) Aleve, Naproxen, Ibuprofen, Motrin, Advil, Goody's, BC's, all herbal medications, fish oil, and all vitamins.                     Do NOT Smoke (Tobacco/Vaping) for 24 hours prior to your procedure.  If you use a CPAP at night, you may bring your mask/headgear for your overnight stay.   Contacts, glasses, piercing's, hearing aid's, dentures or partials may not be worn into surgery, please bring cases for these belongings.    For patients admitted to the hospital, discharge time will be determined by your treatment team.   Patients discharged the day of surgery will not be allowed to drive home, and someone needs to stay with them for 24 hours.  SURGICAL WAITING ROOM VISITATION Patients having surgery or a procedure may have two support people in the waiting room. These visitors may be switched out with other visitors if needed. Children under the age of 42 must have an adult accompany them who is not the patient. If the patient needs to stay at the hospital during part of their recovery, the visitor guidelines for inpatient rooms apply.  Please refer to the [facility] website for the visitor guidelines for Inpatients (after your surgery is over and you are in a regular room).    Special instructions:   [facility]- Preparing For Surgery  Before surgery, you can play an important role. Because skin is not  sterile, your skin needs to be as free of germs as possible. You can reduce the number of germs on your skin by washing with CHG (chlorahexidine gluconate) Soap before surgery.  CHG is an antiseptic cleaner which kills germs and bonds with the skin to continue killing germs even after washing.    Oral Hygiene is also important to reduce your risk of infection.  Remember - BRUSH YOUR TEETH THE MORNING OF SURGERY WITH YOUR REGULAR TOOTHPASTE  Please do not use if you have an allergy to CHG or antibacterial soaps. If your skin becomes reddened/irritated stop using the CHG.  Do not shave (including legs and underarms) for at least 48 hours prior to first CHG shower. It is OK to shave your face.  Please follow these instructions carefully.   Shower the NIGHT BEFORE SURGERY and the MORNING OF SURGERY  If you chose to wash your hair, wash your hair first as usual with your normal shampoo.  After you shampoo, rinse your hair and body thoroughly to remove the shampoo.  Use CHG Soap as you would any other liquid soap. You can apply CHG directly to the skin and wash gently with a scrungie or a clean washcloth.   Apply the CHG Soap to your body ONLY FROM THE NECK DOWN.  Do not use on open wounds or open sores. Avoid contact with your eyes, ears, mouth and genitals (private parts).  Wash Face and genitals (private parts)  with your normal soap.   Wash thoroughly, paying special attention to the area where your surgery will be performed.  Thoroughly rinse your body with warm water from the neck down.  DO NOT shower/wash with your normal soap after using and rinsing off the CHG Soap.  Pat yourself dry with a CLEAN TOWEL.  Wear CLEAN PAJAMAS to bed the night before surgery  Place CLEAN SHEETS on your bed the night before your surgery  DO NOT SLEEP WITH PETS.   Day of Surgery: Take a shower with CHG soap. Do not wear jewelry or makeup Do not wear lotions, powders, perfumes, or deodorant. Do not  shave 48 hours prior to surgery.  Do not bring valuables to the hospital.  [facility] is not responsible for any belongings or valuables. Do not wear nail polish, gel polish, artificial nails, or any other type of covering on natural nails (fingers and toes) If you have artificial nails or gel coating that need to be removed by a nail salon, please have this removed prior to surgery. Artificial nails or gel coating may interfere with anesthesia's ability to adequately monitor your vital signs. Wear Clean/Comfortable clothing the morning of surgery Remember to brush your teeth WITH YOUR REGULAR TOOTHPASTE.   Please read over the following fact sheets that you were given.    If you received a COVID test during your pre-op visit  it is requested that you wear a mask when out in public, stay away from anyone that may not be feeling well and notify your surgeon if you develop symptoms. If you have been in contact with anyone that has tested positive in the last 10 days please notify you surgeon.

## 2021-08-04 ENCOUNTER — Encounter (HOSPITAL_COMMUNITY): Payer: Self-pay

## 2021-08-04 ENCOUNTER — Encounter (HOSPITAL_COMMUNITY)
Admission: RE | Admit: 2021-08-04 | Discharge: 2021-08-04 | Disposition: A | Discharge status: Home / Self Care | Payer: Managed Care, Other (non HMO) | Source: Ambulatory Visit | Attending: Obstetrics & Gynecology | Admitting: Obstetrics & Gynecology

## 2021-08-04 ENCOUNTER — Other Ambulatory Visit: Payer: Self-pay

## 2021-08-04 DIAGNOSIS — Z01812 Encounter for preprocedural laboratory examination: Secondary | ICD-10-CM | POA: Insufficient documentation

## 2021-08-04 HISTORY — DX: Anemia, unspecified: D64.9

## 2021-08-04 LAB — CBC
HCT: 35.1 % — ABNORMAL LOW (ref 36.0–46.0)
Hemoglobin: 10.3 g/dL — ABNORMAL LOW (ref 12.0–15.0)
MCH: 21 pg — ABNORMAL LOW (ref 26.0–34.0)
MCHC: 29.3 g/dL — ABNORMAL LOW (ref 30.0–36.0)
MCV: 71.5 fL — ABNORMAL LOW (ref 80.0–100.0)
Platelets: 414 10*3/uL — ABNORMAL HIGH (ref 150–400)
RBC: 4.91 MIL/uL (ref 3.87–5.11)
RDW: 18.9 % — ABNORMAL HIGH (ref 11.5–15.5)
WBC: 7.7 10*3/uL (ref 4.0–10.5)
nRBC: 0 % (ref 0.0–0.2)

## 2021-08-04 NOTE — Progress Notes (Signed)
PCP - none Cardiologist - none  PPM/ICD - denies Device Orders -  Rep Notified -   Chest x-ray - none EKG - none Stress Test - none ECHO - none Cardiac Cath - none  Sleep Study - none CPAP - no  Fasting Blood Sugar - na Checks Blood Sugar _____ times a day  Blood Thinner Instructions:na Aspirin Instructions:na  ERAS Protcol -no PRE-SURGERY Ensure or G2-   COVID TEST- na   Anesthesia review: no  Patient denies shortness of breath, fever, cough and chest pain at PAT appointment   All instructions explained to the patient, with a verbal understanding of the material. Patient agrees to go over the instructions while at home for a better understanding. Patient also instructed to self quarantine after being tested for COVID-19. The opportunity to ask questions was provided.

## 2021-08-11 ENCOUNTER — Encounter (HOSPITAL_COMMUNITY)
Admission: RE | Disposition: A | Discharge status: Home / Self Care | Payer: Self-pay | Source: Home / Self Care | Attending: Obstetrics & Gynecology

## 2021-08-11 ENCOUNTER — Other Ambulatory Visit: Payer: Self-pay | Admitting: Obstetrics & Gynecology

## 2021-08-11 ENCOUNTER — Other Ambulatory Visit: Payer: Self-pay

## 2021-08-11 ENCOUNTER — Inpatient Hospital Stay (HOSPITAL_COMMUNITY)
Admission: RE | Admit: 2021-08-11 | Discharge: 2021-08-13 | DRG: 743 | Disposition: A | Discharge status: Home / Self Care | Payer: Managed Care, Other (non HMO) | Attending: Obstetrics & Gynecology | Admitting: Obstetrics & Gynecology

## 2021-08-11 ENCOUNTER — Encounter (HOSPITAL_COMMUNITY): Admission: RE | Payer: Self-pay | Source: Ambulatory Visit

## 2021-08-11 ENCOUNTER — Inpatient Hospital Stay (HOSPITAL_COMMUNITY)
Admission: RE | Admit: 2021-08-11 | Payer: Managed Care, Other (non HMO) | Source: Ambulatory Visit | Admitting: Obstetrics & Gynecology

## 2021-08-11 ENCOUNTER — Encounter (HOSPITAL_COMMUNITY): Payer: Self-pay | Admitting: Obstetrics & Gynecology

## 2021-08-11 ENCOUNTER — Inpatient Hospital Stay (HOSPITAL_BASED_OUTPATIENT_CLINIC_OR_DEPARTMENT_OTHER): Payer: Managed Care, Other (non HMO) | Admitting: Anesthesiology

## 2021-08-11 ENCOUNTER — Inpatient Hospital Stay (HOSPITAL_COMMUNITY): Payer: Managed Care, Other (non HMO) | Admitting: Anesthesiology

## 2021-08-11 DIAGNOSIS — D649 Anemia, unspecified: Secondary | ICD-10-CM | POA: Diagnosis present

## 2021-08-11 DIAGNOSIS — D251 Intramural leiomyoma of uterus: Secondary | ICD-10-CM | POA: Diagnosis present

## 2021-08-11 DIAGNOSIS — D630 Anemia in neoplastic disease: Secondary | ICD-10-CM

## 2021-08-11 DIAGNOSIS — D252 Subserosal leiomyoma of uterus: Secondary | ICD-10-CM

## 2021-08-11 DIAGNOSIS — D250 Submucous leiomyoma of uterus: Principal | ICD-10-CM | POA: Diagnosis present

## 2021-08-11 DIAGNOSIS — N920 Excessive and frequent menstruation with regular cycle: Secondary | ICD-10-CM | POA: Diagnosis present

## 2021-08-11 DIAGNOSIS — Z9889 Other specified postprocedural states: Secondary | ICD-10-CM

## 2021-08-11 HISTORY — PX: MYOMECTOMY: SHX85

## 2021-08-11 LAB — POCT PREGNANCY, URINE: Preg Test, Ur: NEGATIVE

## 2021-08-11 LAB — TYPE AND SCREEN
ABO/RH(D): B POS
Antibody Screen: POSITIVE

## 2021-08-11 SURGERY — MYOMECTOMY, ABDOMINAL APPROACH
Anesthesia: Choice

## 2021-08-11 SURGERY — MYOMECTOMY, ABDOMINAL APPROACH
Anesthesia: General | Site: Abdomen

## 2021-08-11 MED ORDER — 0.9 % SODIUM CHLORIDE (POUR BTL) OPTIME
TOPICAL | Status: DC | PRN
  Administered 2021-08-11 (×2): 1000 mL

## 2021-08-11 MED ORDER — HEMOSTATIC AGENTS (NO CHARGE) OPTIME
TOPICAL | Status: DC | PRN
  Administered 2021-08-11: 1 via TOPICAL

## 2021-08-11 MED ORDER — ZOLPIDEM TARTRATE 5 MG PO TABS: 5.0000 mg | ORAL_TABLET | Freq: Every evening | ORAL | Status: DC | PRN

## 2021-08-11 MED ORDER — FENTANYL CITRATE (PF) 250 MCG/5ML IJ SOLN
INTRAMUSCULAR | Status: AC
  Filled 2021-08-11: qty 5

## 2021-08-11 MED ORDER — ONDANSETRON HCL 4 MG/2ML IJ SOLN
4.0000 mg | Freq: Four times a day (QID) | INTRAMUSCULAR | Status: DC | PRN
  Administered 2021-08-11: 4 mg via INTRAVENOUS
  Filled 2021-08-11: qty 2

## 2021-08-11 MED ORDER — VASOPRESSIN 20 UNIT/ML IV SOLN
INTRAVENOUS | Status: AC
  Filled 2021-08-11: qty 1

## 2021-08-11 MED ORDER — SENNOSIDES-DOCUSATE SODIUM 8.6-50 MG PO TABS
1.0000 | ORAL_TABLET | Freq: Every evening | ORAL | Status: DC | PRN
Start: 2021-08-11 — End: 2021-08-13

## 2021-08-11 MED ORDER — FENTANYL CITRATE (PF) 250 MCG/5ML IJ SOLN
INTRAMUSCULAR | Status: DC | PRN
  Administered 2021-08-11: 100 ug via INTRAVENOUS
  Administered 2021-08-11 (×3): 50 ug via INTRAVENOUS

## 2021-08-11 MED ORDER — SIMETHICONE 80 MG PO CHEW: 80.0000 mg | CHEWABLE_TABLET | Freq: Four times a day (QID) | ORAL | Status: DC | PRN

## 2021-08-11 MED ORDER — SODIUM CHLORIDE (PF) 0.9 % IJ SOLN
INTRAMUSCULAR | Status: AC
  Filled 2021-08-11: qty 100

## 2021-08-11 MED ORDER — DOCUSATE SODIUM 100 MG PO CAPS
100.0000 mg | ORAL_CAPSULE | Freq: Two times a day (BID) | ORAL | Status: DC
  Administered 2021-08-11 – 2021-08-12 (×3): 100 mg via ORAL
  Filled 2021-08-11 (×3): qty 1

## 2021-08-11 MED ORDER — CHLORHEXIDINE GLUCONATE 0.12 % MT SOLN
OROMUCOSAL | Status: AC
  Administered 2021-08-11: 15 mL via OROMUCOSAL
  Filled 2021-08-11: qty 15

## 2021-08-11 MED ORDER — MIDAZOLAM HCL 2 MG/2ML IJ SOLN
INTRAMUSCULAR | Status: AC
  Filled 2021-08-11: qty 2

## 2021-08-11 MED ORDER — LACTATED RINGERS IV SOLN: INTRAVENOUS | Status: DC

## 2021-08-11 MED ORDER — OXYCODONE HCL 5 MG PO TABS: 5.0000 mg | ORAL_TABLET | ORAL | Status: DC | PRN

## 2021-08-11 MED ORDER — HYDROMORPHONE HCL 1 MG/ML IJ SOLN
0.2500 mg | INTRAMUSCULAR | Status: DC | PRN
  Administered 2021-08-11: 0.5 mg via INTRAVENOUS
  Administered 2021-08-11: 0.25 mg via INTRAVENOUS

## 2021-08-11 MED ORDER — BISACODYL 5 MG PO TBEC: 5.0000 mg | DELAYED_RELEASE_TABLET | Freq: Every day | ORAL | Status: DC | PRN

## 2021-08-11 MED ORDER — ACETAMINOPHEN 500 MG PO TABS
1000.0000 mg | ORAL_TABLET | Freq: Four times a day (QID) | ORAL | Status: DC
  Administered 2021-08-11 – 2021-08-12 (×6): 1000 mg via ORAL
  Filled 2021-08-11 (×7): qty 2

## 2021-08-11 MED ORDER — CHLORHEXIDINE GLUCONATE 0.12 % MT SOLN
15.0000 mL | Freq: Once | OROMUCOSAL | Status: AC
  Filled 2021-08-11: qty 15

## 2021-08-11 MED ORDER — VASOPRESSIN 20 UNIT/ML IV SOLN
INTRAVENOUS | Status: DC | PRN
  Administered 2021-08-11: 28 mL

## 2021-08-11 MED ORDER — DEXAMETHASONE SODIUM PHOSPHATE 10 MG/ML IJ SOLN
INTRAMUSCULAR | Status: DC | PRN
  Administered 2021-08-11: 10 mg via INTRAVENOUS

## 2021-08-11 MED ORDER — PANTOPRAZOLE SODIUM 40 MG PO TBEC
40.0000 mg | DELAYED_RELEASE_TABLET | Freq: Every day | ORAL | Status: DC
  Administered 2021-08-11 – 2021-08-12 (×2): 40 mg via ORAL
  Filled 2021-08-11 (×2): qty 1

## 2021-08-11 MED ORDER — PROPOFOL 10 MG/ML IV BOLUS
INTRAVENOUS | Status: AC
  Filled 2021-08-11: qty 20

## 2021-08-11 MED ORDER — ONDANSETRON HCL 4 MG/2ML IJ SOLN
INTRAMUSCULAR | Status: DC | PRN
  Administered 2021-08-11: 4 mg via INTRAVENOUS

## 2021-08-11 MED ORDER — ONDANSETRON HCL 4 MG PO TABS: 4.0000 mg | ORAL_TABLET | Freq: Four times a day (QID) | ORAL | Status: DC | PRN

## 2021-08-11 MED ORDER — BUPIVACAINE LIPOSOME 1.3 % IJ SUSP
INTRAMUSCULAR | Status: DC | PRN
  Administered 2021-08-11: 20 mL

## 2021-08-11 MED ORDER — KETOROLAC TROMETHAMINE 30 MG/ML IJ SOLN
30.0000 mg | Freq: Four times a day (QID) | INTRAMUSCULAR | Status: DC | PRN
  Administered 2021-08-11: 30 mg via INTRAVENOUS

## 2021-08-11 MED ORDER — ORAL CARE MOUTH RINSE: 15.0000 mL | Freq: Once | OROMUCOSAL | Status: AC

## 2021-08-11 MED ORDER — MISOPROSTOL 200 MCG PO TABS
400.0000 ug | ORAL_TABLET | Freq: Once | ORAL | Status: AC
Start: 2021-08-11 — End: 2021-08-11
  Administered 2021-08-11: 400 ug via RECTAL
  Filled 2021-08-11: qty 2

## 2021-08-11 MED ORDER — DEXAMETHASONE SODIUM PHOSPHATE 10 MG/ML IJ SOLN
INTRAMUSCULAR | Status: AC
  Filled 2021-08-11: qty 1

## 2021-08-11 MED ORDER — TRANEXAMIC ACID-NACL 1000-0.7 MG/100ML-% IV SOLN
1000.0000 mg | INTRAVENOUS | Status: AC
Start: 2021-08-11 — End: 2021-08-11
  Administered 2021-08-11: 1000 mg via INTRAVENOUS
  Filled 2021-08-11 (×2): qty 100

## 2021-08-11 MED ORDER — ONDANSETRON HCL 4 MG/2ML IJ SOLN
INTRAMUSCULAR | Status: AC
  Filled 2021-08-11: qty 2

## 2021-08-11 MED ORDER — KETOROLAC TROMETHAMINE 30 MG/ML IJ SOLN
30.0000 mg | Freq: Four times a day (QID) | INTRAMUSCULAR | Status: AC
  Administered 2021-08-11 – 2021-08-12 (×3): 30 mg via INTRAVENOUS
  Filled 2021-08-11 (×3): qty 1

## 2021-08-11 MED ORDER — CEFAZOLIN SODIUM-DEXTROSE 2-4 GM/100ML-% IV SOLN
2.0000 g | INTRAVENOUS | Status: AC
  Administered 2021-08-11: 2 g via INTRAVENOUS
  Filled 2021-08-11: qty 100

## 2021-08-11 MED ORDER — ROCURONIUM BROMIDE 10 MG/ML (PF) SYRINGE
PREFILLED_SYRINGE | INTRAVENOUS | Status: AC
  Filled 2021-08-11: qty 10

## 2021-08-11 MED ORDER — ACETAMINOPHEN 500 MG PO TABS
1000.0000 mg | ORAL_TABLET | Freq: Once | ORAL | Status: AC
  Administered 2021-08-11: 1000 mg via ORAL
  Filled 2021-08-11: qty 2

## 2021-08-11 MED ORDER — MENTHOL 3 MG MT LOZG: 1.0000 | LOZENGE | OROMUCOSAL | Status: DC | PRN

## 2021-08-11 MED ORDER — HYDROMORPHONE HCL 1 MG/ML IJ SOLN: 0.2000 mg | INTRAMUSCULAR | Status: DC | PRN

## 2021-08-11 MED ORDER — HYDROMORPHONE HCL 1 MG/ML IJ SOLN
INTRAMUSCULAR | Status: AC
  Filled 2021-08-11: qty 1

## 2021-08-11 MED ORDER — KETOROLAC TROMETHAMINE 30 MG/ML IJ SOLN
INTRAMUSCULAR | Status: AC
  Filled 2021-08-11: qty 1

## 2021-08-11 MED ORDER — CEFAZOLIN SODIUM-DEXTROSE 2-4 GM/100ML-% IV SOLN
INTRAVENOUS | Status: AC
  Filled 2021-08-11: qty 100

## 2021-08-11 MED ORDER — LIDOCAINE 2% (20 MG/ML) 5 ML SYRINGE
INTRAMUSCULAR | Status: AC
  Filled 2021-08-11: qty 5

## 2021-08-11 MED ORDER — SUGAMMADEX SODIUM 200 MG/2ML IV SOLN
INTRAVENOUS | Status: DC | PRN
  Administered 2021-08-11: 200 mg via INTRAVENOUS

## 2021-08-11 MED ORDER — FLEET ENEMA 7-19 GM/118ML RE ENEM: 1.0000 | ENEMA | Freq: Once | RECTAL | Status: DC | PRN

## 2021-08-11 MED ORDER — BUPIVACAINE LIPOSOME 1.3 % IJ SUSP
INTRAMUSCULAR | Status: AC
  Filled 2021-08-11: qty 20

## 2021-08-11 MED ORDER — IBUPROFEN 600 MG PO TABS
600.0000 mg | ORAL_TABLET | Freq: Four times a day (QID) | ORAL | Status: DC
  Administered 2021-08-12 – 2021-08-13 (×3): 600 mg via ORAL
  Filled 2021-08-11 (×3): qty 1

## 2021-08-11 MED ORDER — MIDAZOLAM HCL 2 MG/2ML IJ SOLN
INTRAMUSCULAR | Status: DC | PRN
  Administered 2021-08-11: 2 mg via INTRAVENOUS

## 2021-08-11 MED ORDER — POVIDONE-IODINE 10 % EX SWAB: Freq: Once | CUTANEOUS | Status: AC

## 2021-08-11 MED ORDER — POVIDONE-IODINE 10 % EX SWAB: 2.0000 "application " | Freq: Once | CUTANEOUS | Status: DC

## 2021-08-11 MED ORDER — LIDOCAINE 2% (20 MG/ML) 5 ML SYRINGE
INTRAMUSCULAR | Status: DC | PRN
  Administered 2021-08-11: 60 mg via INTRAVENOUS

## 2021-08-11 MED ORDER — PROPOFOL 10 MG/ML IV BOLUS
INTRAVENOUS | Status: DC | PRN
  Administered 2021-08-11: 130 mg via INTRAVENOUS

## 2021-08-11 MED ORDER — ROCURONIUM BROMIDE 10 MG/ML (PF) SYRINGE
PREFILLED_SYRINGE | INTRAVENOUS | Status: DC | PRN
  Administered 2021-08-11: 50 mg via INTRAVENOUS

## 2021-08-11 SURGICAL SUPPLY — 47 items
APL SKNCLS STERI-STRIP NONHPOA (GAUZE/BANDAGES/DRESSINGS) ×1
BARRIER ADHS 3X4 INTERCEED (GAUZE/BANDAGES/DRESSINGS) ×1 IMPLANT
BENZOIN TINCTURE PRP APPL 2/3 (GAUZE/BANDAGES/DRESSINGS) ×1 IMPLANT
BRR ADH 4X3 ABS CNTRL BYND (GAUZE/BANDAGES/DRESSINGS) ×1
CELLS DAT CNTRL 66122 CELL SVR (MISCELLANEOUS) ×1 IMPLANT
DRAIN PENROSE 1/2X12 LTX STRL (WOUND CARE) IMPLANT
DRAPE WARM FLUID 44X44 (DRAPES) ×2 IMPLANT
DRSG OPSITE POSTOP 4X10 (GAUZE/BANDAGES/DRESSINGS) ×2 IMPLANT
DRSG OPSITE POSTOP 4X8 (GAUZE/BANDAGES/DRESSINGS) ×1 IMPLANT
DURAPREP 26ML APPLICATOR (WOUND CARE) ×2 IMPLANT
GAUZE 4X4 16PLY ~~LOC~~+RFID DBL (SPONGE) ×2 IMPLANT
GLOVE BIOGEL PI IND STRL 7.0 (GLOVE) ×4 IMPLANT
GLOVE BIOGEL PI INDICATOR 7.0 (GLOVE) ×4
GLOVE SURG SS PI 6.5 STRL IVOR (GLOVE) ×2 IMPLANT
GOWN STRL REUS W/ TWL LRG LVL3 (GOWN DISPOSABLE) ×3 IMPLANT
GOWN STRL REUS W/TWL LRG LVL3 (GOWN DISPOSABLE) ×6
HEMOSTAT ARISTA ABSORB 3G PWDR (HEMOSTASIS) ×1 IMPLANT
KIT TURNOVER KIT B (KITS) ×2 IMPLANT
MANIFOLD NEPTUNE II (INSTRUMENTS) ×2 IMPLANT
NEEDLE HYPO 22GX1.5 SAFETY (NEEDLE) ×2 IMPLANT
NS IRRIG 1000ML POUR BTL (IV SOLUTION) ×2 IMPLANT
PACK ABDOMINAL GYN (CUSTOM PROCEDURE TRAY) ×2 IMPLANT
PAD ARMBOARD 7.5X6 YLW CONV (MISCELLANEOUS) ×2 IMPLANT
PAD OB MATERNITY 4.3X12.25 (PERSONAL CARE ITEMS) ×2 IMPLANT
RETRACTOR WND ALEXIS 18 MED (MISCELLANEOUS) IMPLANT
RTRCTR C-SECT PINK 25CM LRG (MISCELLANEOUS) IMPLANT
RTRCTR WOUND ALEXIS 18CM MED (MISCELLANEOUS) ×2
SPONGE T-LAP 18X18 ~~LOC~~+RFID (SPONGE) IMPLANT
STRIP CLOSURE SKIN 1/2X4 (GAUZE/BANDAGES/DRESSINGS) ×1 IMPLANT
SUT CHROMIC 3 0 CT 1 27 (SUTURE) ×1 IMPLANT
SUT MON AB 2-0 CT1 27 (SUTURE) ×1 IMPLANT
SUT MON AB 3-0 SH 27 (SUTURE) ×2
SUT MON AB 3-0 SH27 (SUTURE) IMPLANT
SUT MON AB 4-0 PS1 27 (SUTURE) ×2 IMPLANT
SUT PDS AB 1 TP1 96 (SUTURE) ×2 IMPLANT
SUT PLAIN 2 0 (SUTURE) ×2
SUT PLAIN 2 0 XLH (SUTURE) IMPLANT
SUT PLAIN ABS 2-0 CT1 27XMFL (SUTURE) IMPLANT
SUT VIC AB 0 CT1 27 (SUTURE) ×8
SUT VIC AB 0 CT1 27XBRD ANBCTR (SUTURE) ×4 IMPLANT
SUT VIC AB 0 CT1 36 (SUTURE) ×2 IMPLANT
SUT VIC AB 3-0 SH 27 (SUTURE) ×8
SUT VIC AB 3-0 SH 27X BRD (SUTURE) ×4 IMPLANT
SYR CONTROL 10ML LL (SYRINGE) ×2 IMPLANT
TOWEL GREEN STERILE FF (TOWEL DISPOSABLE) ×4 IMPLANT
TRAY FOLEY W/BAG SLVR 14FR (SET/KITS/TRAYS/PACK) ×2 IMPLANT
YANKAUER SUCT BULB TIP NO VENT (SUCTIONS) ×1 IMPLANT

## 2021-08-11 NOTE — Progress Notes (Signed)
PT INTERVIEW DONE BY X. HARDESTY RN

## 2021-08-11 NOTE — Transfer of Care (Signed)
Immediate Anesthesia Transfer of Care Note  Patient: Sharon Pratt  Procedure(s) Performed: ABDOMINAL MYOMECTOMY (Abdomen)  Patient Location: PACU  Anesthesia Type:General  Level of Consciousness: awake  Airway & Oxygen Therapy: Patient Spontanous Breathing  Post-op Assessment: Report given to RN and Post -op Vital signs reviewed and stable  Post vital signs: Reviewed and stable  Last Vitals:  Vitals Value Taken Time  BP 133/90 08/11/21 1507  Temp 36.9 C 08/11/21 1507  Pulse 80 08/11/21 1512  Resp 20 08/11/21 1512  SpO2 100 % 08/11/21 1512  Vitals shown include unvalidated device data.  Last Pain:  Vitals:   08/11/21 1138  TempSrc:   PainSc: 0-No pain         Complications: No notable events documented.

## 2021-08-11 NOTE — Anesthesia Procedure Notes (Signed)
Procedure Name: Intubation Date/Time: 08/11/2021 12:23 PM  Performed by: Lorie Phenix, CRNAPre-anesthesia Checklist: Patient identified, Emergency Drugs available, Suction available and Patient being monitored Patient Re-evaluated:Patient Re-evaluated prior to induction Oxygen Delivery Method: Circle system utilized Preoxygenation: Pre-oxygenation with 100% oxygen Induction Type: IV induction Ventilation: Mask ventilation without difficulty Laryngoscope Size: Mac and 3 Grade View: Grade I Tube type: Oral Tube size: 7.0 mm Number of attempts: 1 Airway Equipment and Method: Stylet Placement Confirmation: ETT inserted through vocal cords under direct vision, positive ETCO2 and breath sounds checked- equal and bilateral Secured at: 22 cm Tube secured with: Tape Dental Injury: Teeth and Oropharynx as per pre-operative assessment

## 2021-08-11 NOTE — H&P (Signed)
Sharon Pratt is an 31 y.o. female with a history of heavy periods, anemia and fibroids here for an abdominal  myomectomy.  She had tried medical management options of ibuprofen and tranexamic acid and without adequate control of her symptoms.    Pertinent Gynecological History: Menses: monthly, heavy.  Bleeding: menorrhagia Contraception: condoms DES exposure: unknown Blood transfusions: none Sexually transmitted diseases: no past history Previous GYN Procedures: D and C.  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   Past Medical History:  Diagnosis Date   Anemia     Past Surgical History:  Procedure Laterality Date   CESAREAN SECTION  2012   CESAREAN SECTION  2008   DILATION AND CURETTAGE OF UTERUS  2013   molar pregnancy    Family History  Problem Relation Age of Onset   Healthy Mother    Healthy Father     Social History:  reports that she has never smoked. She has never used smokeless tobacco. She reports that she does not currently use alcohol. She reports that she does not use drugs.  Allergies: No Known Allergies  No current outpatient medications    Current Facility-Administered Medications:    acetaminophen (TYLENOL) tablet 1,000 mg, 1,000 mg, Oral, Once, Roderic Palau, MD   ceFAZolin (ANCEF) 2-4 GM/100ML-% IVPB, , , ,    ceFAZolin (ANCEF) IVPB 2g/100 mL premix, 2 g, Intravenous, On Call to OR, Waymon Amato, MD   chlorhexidine (PERIDEX) 0.12 % solution 15 mL, 15 mL, Mouth/Throat, Once **OR** Oral care mouth rinse, 15 mL, Mouth Rinse, Once, Roderic Palau, MD   chlorhexidine (PERIDEX) 0.12 % solution, , , ,    lactated ringers infusion, , Intravenous, Continuous, Alesia Richards, Symone Cornman, MD   lactated ringers infusion, , Intravenous, Continuous, Roderic Palau, MD   misoprostol (CYTOTEC) tablet 400 mcg, 400 mcg, Rectal, Once, Quinlan Mcfall,  MD   povidone-iodine 10 % swab, , Topical, Once, Alesia Richards, Aleck Locklin, MD   tranexamic acid (CYKLOKAPRON) IVPB 1,000 mg, 1,000 mg, Intravenous, To OR, Caleyah Jr, MD   Review of Systems Constitutional: Denies fevers/chills Cardiovascular: Denies chest pain or palpitations Pulmonary: Denies coughing or wheezing Gastrointestinal: Denies nausea, vomiting or diarrhea Genitourinary: Denies pelvic pain, unusual vaginal discharge, dysuria, urgency or frequency. With unusual vaginal bleeding- heavy periods.  Musculoskeletal: Denies muscle or joint aches and pain.  Neurology: Denies abnormal sensations such as tingling or numbness.    Physical Exam Blood pressure 134/79, pulse 68, temperature 98.4 F (36.9 C), temperature source Oral, resp. rate 18, height '5\' 4"'$  (1.626 m), weight 90.7 kg, last menstrual period 07/19/2021, SpO2 99 %.  Constitutional: She is oriented to person, place, and time. She appears well-developed and well-nourished.  HENT:  Head: Normocephalic and atraumatic.  Neck: Normal range of motion.  Cardiovascular: Normal rate, regular rhythm and normal heart sounds.   Respiratory: Effort normal and breath sounds normal.  GI: Soft. Bowel sounds are normal.  Neurological: She is alert and oriented to person, place, and time.  Skin: Skin is warm and dry.  Psychiatric: She has a normal mood and affect. Her behavior is normal.    Recent Results (from the past 2160 hour(s))  CBC     Status: Abnormal   Collection Time: 08/04/21  8:43 AM  Result Value Ref Range   WBC 7.7 4.0 - 10.5 K/uL   RBC 4.91 3.87 - 5.11 MIL/uL   Hemoglobin 10.3 (L) 12.0 -  15.0 g/dL   HCT 35.1 (L) 36.0 - 46.0 %   MCV 71.5 (L) 80.0 - 100.0 fL   MCH 21.0 (L) 26.0 - 34.0 pg   MCHC 29.3 (L) 30.0 - 36.0 g/dL   RDW 18.9 (H) 11.5 - 15.5 %   Platelets 414 (H) 150 - 400 K/uL   nRBC 0.0 0.0 - 0.2 %    Comment: Performed at [facility] [address], [place], [place] [address]  Type and screen     Status: None  (Preliminary result)   Collection Time: 08/04/21  8:45 AM  Result Value Ref Range   ABO/RH(D) B POS    Antibody Screen POS    Sample Expiration 08/18/2021,2359    Antibody Identification ANTI LEA (Lewis a)    PT AG Type NEGATIVE FOR LEWIS A ANTIGEN    Extend sample reason      NO TRANSFUSIONS OR PREGNANCY IN THE PAST 3 MONTHS Performed at [facility], [place] [address], [place], [place] [address]    Unit Number [id number]    Blood Component Type RED CELLS,LR    Unit division 00    Status of Unit ALLOCATED    Transfusion Status PENDING    Crossmatch Result COMPATIBLE    Unit Number [id number]    Blood Component Type RED CELLS,LR    Unit division 00    Status of Unit ALLOCATED    Transfusion Status PENDING    Crossmatch Result COMPATIBLE   BPAM RBC     Status: None (Preliminary result)   Collection Time: 08/04/21  8:45 AM  Result Value Ref Range   Blood Product Unit Number [id number]    PRODUCT CODE C1275T70    Unit Type and Rh 7300    Blood Product Expiration Date [id number]    Blood Product Unit Number [id number]    PRODUCT CODE S1779T90    Unit Type and Rh 7300    Blood Product Expiration Date 300923300762   Pregnancy, urine POC     Status: None   Collection Time: 08/11/21 11:28 AM  Result Value Ref Range   Preg Test, Ur NEGATIVE NEGATIVE    Comment:        THE SENSITIVITY OF THIS METHODOLOGY IS >24 mIU/mL     Pelvic ultrasound 02/27/21: Uterus11.4 x 8.1 x 7.7cm. 2cm right posterior subserosal fibroid. 4 cm mid posterior intramural fibroid. 4 cm left fundal subserosal fibroid. Normal left and right ovaries.     Assessment/Plan: 31 y/o Para 2 with symptomatic intramural and subserosal uterine fibroids here for an abdominal myomectomy.   -This procedure has been fully reviewed with the patient and written informed consent has been obtained.  - Discussed with patient risks that include but are not limited to risks of bleeding, infection, damage to  organs, blood transfusion, hysterectomy, need for cesarean delivery.  All her questions were answered and she expressed understanding.   -Admit to [facility] Main OR. -NPO and IV fluids. -Rectal cytotec and IV tranexamic acid to be given pre-operatively.   Archie Endo, MD.  08/11/2021, 11:32 AM

## 2021-08-11 NOTE — Progress Notes (Signed)
Spoke with Dr. Alesia Richards regarding PCA orders. Since pt's pain has been controlled with PRN dilaudid and scheduled toradol, it is ok to hold off on PCA for now. May release PCA orders if pt's pain becomes uncontrolled with PRN pain meds.

## 2021-08-11 NOTE — Op Note (Signed)
Name:  Sharon Pratt DOB:  Feb 15, 1990   MRN:  [id number]  Pre-op diagnosis: 1. Intramural and subserosal fibroids. 2. Menorrhagia. 3. Anemia.     Post-op diagnosis: Same as above.   Procedures:  Abdominal myomectomy  Anesthesia:  General ETA, Dr. Roderic Palau.  Attending Surgeon: Acey Lav  Assistant: Dr. Sanjuana Kava  ATTENDING ATTESTATION: I was present and scrubbed and performed the procedure and the assistant was required due to the complexity of anatomy.   Complications: None  Findings:  Uterus with multiple uterine fibroids, 3 subserosal fibroids, 4 intramural fibroids,  total weight of fibroids 178 grams: One 6 cm subserosal fibroid on left fundal anterior uterus, 2 cm subserosal fibroid on right posterior lower uterus, 1 cm subserosal on anterior right side of uterus, 4 cm mid posterior intramural fibroid extending to the endometrial lining, 3 cm right  posterior intramural fibroid. 1cm right posterior intramural fibroid, 0.5 cm right posterior intramural fibroid.    Normal bilateral ovaries and fallopian tubes. Normal bowels.    EBL: 50 cc  IVF: 1200 cc LR  Urine output:  100 cc  Indications: 31 y/o P2 here for abdominal myomectomy for symptomatic fibroids.     PROCEDURE: Informed consent was obtained from the patient to undergo the procedure after explaining the risks, benefits and alternatives of the procedures.  She was prepped vaginally and abdominally and draped in the usual sterile fashion and a foley catheter was placed in her bladder.    IV Ancef, IV tranexamic acid and rectal cytotec were given pre-operatively.  Exparel, total 20cc was given as local anesthesia on lower abdomen around her prior pfannenstiel incision.  An incision was made over the prior incision with the scalpel and carried through the underlying subcutaneous layer and fascia with the bovie.  Small perforators were contained with the bovie.  The fascia was nicked in the midline and the  fascia separated from the rectus muscles superiorly, inferiorly and bilaterally.  Rectus muscle was separated in the midline, peritoneum entered and the uterus was inspected and the fibroids noted.  The medium Alexis retractor was placed in after packing away the bowels.  The uterus was mobilized out of the pelvis  and above findings were noted.  Vasopressin (20 units mixed in 100 cc NS) was injected over the 3 cm right posterior subserosal fibroid.  The fibroid was incised horizontally with the bovie and the fibroid and pseudocapsule was separated from the sorrounding myometrium with gauze pad and bovie.  Similar procedure was done to remove 3 intramural fibroids on the right side using two more incisions on the uterus.  A similar procedure was done to remove the mid posterior intramural fibroid using another uterine incision.  This particular fibroid was noted to extend to endometrium and endometrial cavity was entered with its removal.  In case of any future pregnancy it is recommended that the patient undergo a cesarean delivery due to uterine cavity entry during the myomectomy. It is also duly noted that the patient has already had two prior cesarean sections.  Attention was turned to the 6 cm left fundal subserosal fibroid where its broad base was injected with vasopressin. It was then separated from its attachment to the uterine side with the bovie. This fibroid did extend to the round ligament, therefore the round ligament was doubly clamped and dissected to allow complete removal of the fibroid.  The distal end of the round ligament was sutured with 0-vicryl while the small proximal end of the round  ligament was attached to the fibroid and removed with the fibroid.    Another sub centimeter subserosal fibroid was removed after injecting with vasopressin at the base. The  uterine defects were then closed, total 5 uterine defects.  Where the 4 cm mid posterior intramural was located was closed of in layers,  first the endometrial lining was closed, then the myometrial layers closed off in 2 layers horizontally with 0-vicryl.  Baseball stitch of 3-0 vicryl was used to close the serosa.  Other uterine defects were closed in 1 layer myometrial closure with 0-vicryl then serosal closure with 3-0 vicryl.  On the left anterior uterus where the 6 cm subserosal had been attached, the superficial myometrial defect was closed off with 2-0 monocryl followed by serosa closed with 3-0 monocryl.    The pelvis was irrigated and suctioned out.   Arista powder was placed over the defects to enhance hemostasis then interceed was then placed over  myometrium incisions.  Excellent hemostasis was noted over the repaired incisions.  The alexis was removed and the peritoneum was reapproximated with 0-chromic. The fascia was closed off using looped 0-PDS in a running stitch. The subcutaneous layer was closed off with plain suture.  The skin was closed off with 4-0 monocryl in a subcuticular stitch.  Steri strips and honey comb dressing were then placed over the incision.  The patient was awoken from anesthesia and taken to the recovery room in stable condition.  Dr. Waymon Amato.  08/11/2021.  3:54 PM.

## 2021-08-11 NOTE — Interval H&P Note (Signed)
History and Physical Interval Note:  08/11/2021 11:57 AM  Sharon Pratt  has presented today for surgery, with the diagnosis of INTRAMURAL AND SUBSEROSAL FIBROIDS.  The various methods of treatment have been discussed with the patient and family. After consideration of risks, benefits and other options for treatment, the patient has consented to  Procedure(s): ABDOMINAL MYOMECTOMY (N/A) as a surgical intervention.  The patient's history has been reviewed, patient examined, no change in status, stable for surgery.  I have reviewed the patient's chart and labs.  Questions were answered to the patient's satisfaction.    Archie Endo, MD.

## 2021-08-11 NOTE — Discharge Instructions (Signed)
   Sharon Pratt,  1. While at home remember to walk regularly, for at least 1 hour each a day, as this will help with your quick recovery.  You may also continue your usual activities of daily living such as taking showers, preparing food and light cleaning that require standing or walking around. When you take your showers keep them short and ensure you pat the incision/incision dressing dry after showering. Do not rub the incision/incision dressing or apply soap directly to the incision/incision dressing. Ensure that you rinse soap from the incision/incision dressing if used during showering.     2. You may remove the honey comb incision dressing in 1 week (next Monday). To do this, spray water on the incision to get it wet then pill it off gently from the edges.  You will see steri strips below the dressing and you may leave the steri strips.  Continue with showering as usual and pat the incision and steri strips dry after showering. The steri strips will eventually fall off by themselves.   3. Do not do any heavy lifting, i.e nothing heavier than 15 lbs for the next 6 weeks.  4.  Do not use tampons or douche or take baths, do not have any sexual intercourse for the next 6 weeks.   5. Take your pain medication (over the counter tylenol, prescribed ibuprofen, prescribed oxycodone) as needed for pain. Let me know if your pain is not well controlled despite the pain medication.   6. Get plenty of rest for the next two weeks to allow your body to recover.  You may feel tired in the process, this is normal.  7. You may take an over the counter stool softener such as colace if you are constipated.     8.  If you feel fevers or chills please check your temperature and if equal to or greater than 100.4 please let me know.    9.  Please keep your upcoming appointment at the office as scheduled or the office will contact you within a few days to schedule one of you don't have one.   10. Do not drive if you  are having to take narcotics such as oxycodone.   11. Call me if with any questions or concerns.   I wish you a quick and safe recovery.  Dr. Waymon Amato [facility] [phone]  Office extension [phone].

## 2021-08-11 NOTE — Anesthesia Postprocedure Evaluation (Signed)
Anesthesia Post Note  Patient: Sharon Pratt  Procedure(s) Performed: ABDOMINAL MYOMECTOMY (Abdomen)     Patient location during evaluation: PACU Anesthesia Type: General Level of consciousness: awake and alert Pain management: pain level controlled Vital Signs Assessment: post-procedure vital signs reviewed and stable Respiratory status: spontaneous breathing, nonlabored ventilation and respiratory function stable Cardiovascular status: blood pressure returned to baseline and stable Postop Assessment: no apparent nausea or vomiting Anesthetic complications: no   No notable events documented.  Last Vitals:  Vitals:   08/11/21 1515 08/11/21 1530  BP: (!) 141/79 (!) 141/82  Pulse: 77 68  Resp: 14 10  Temp:    SpO2: 100% 100%    Last Pain:  Vitals:   08/11/21 1530  TempSrc:   PainSc: Asleep                 Marisol Giambra,W. EDMOND

## 2021-08-11 NOTE — Anesthesia Preprocedure Evaluation (Addendum)
Anesthesia Evaluation  Patient identified by MRN, date of birth, ID band Patient awake    Reviewed: Allergy & Precautions, H&P , NPO status , Patient's Chart, lab work & pertinent test results  Airway Mallampati: II  TM Distance: >3 FB Neck ROM: Full    Dental no notable dental hx. (+) Teeth Intact, Dental Advisory Given   Pulmonary neg pulmonary ROS,    Pulmonary exam normal breath sounds clear to auscultation       Cardiovascular negative cardio ROS   Rhythm:Regular Rate:Normal     Neuro/Psych negative neurological ROS  negative psych ROS   GI/Hepatic negative GI ROS, Neg liver ROS,   Endo/Other  negative endocrine ROS  Renal/GU negative Renal ROS  negative genitourinary   Musculoskeletal   Abdominal   Peds  Hematology  (+) Blood dyscrasia, anemia ,   Anesthesia Other Findings   Reproductive/Obstetrics negative OB ROS                            Anesthesia Physical Anesthesia Plan  ASA: 2  Anesthesia Plan: General   Post-op Pain Management: Tylenol PO (pre-op)* and Toradol IV (intra-op)*   Induction: Intravenous  PONV Risk Score and Plan: 4 or greater and Ondansetron, Dexamethasone and Midazolam  Airway Management Planned: Oral ETT  Additional Equipment:   Intra-op Plan:   Post-operative Plan: Extubation in OR  Informed Consent: I have reviewed the patients History and Physical, chart, labs and discussed the procedure including the risks, benefits and alternatives for the proposed anesthesia with the patient or authorized representative who has indicated his/her understanding and acceptance.     Dental advisory given  Plan Discussed with: CRNA  Anesthesia Plan Comments:         Anesthesia Quick Evaluation

## 2021-08-12 ENCOUNTER — Encounter (HOSPITAL_COMMUNITY): Payer: Self-pay | Admitting: Obstetrics & Gynecology

## 2021-08-12 LAB — CBC
HCT: 29.1 % — ABNORMAL LOW (ref 36.0–46.0)
Hemoglobin: 8.8 g/dL — ABNORMAL LOW (ref 12.0–15.0)
MCH: 21 pg — ABNORMAL LOW (ref 26.0–34.0)
MCHC: 30.2 g/dL (ref 30.0–36.0)
MCV: 69.3 fL — ABNORMAL LOW (ref 80.0–100.0)
Platelets: 397 10*3/uL (ref 150–400)
RBC: 4.2 MIL/uL (ref 3.87–5.11)
RDW: 18.2 % — ABNORMAL HIGH (ref 11.5–15.5)
WBC: 16.2 10*3/uL — ABNORMAL HIGH (ref 4.0–10.5)
nRBC: 0 % (ref 0.0–0.2)

## 2021-08-12 MED ORDER — SODIUM CHLORIDE 0.9 % IV SOLN
500.0000 mg | Freq: Once | INTRAVENOUS | Status: AC
  Administered 2021-08-12: 500 mg via INTRAVENOUS
  Filled 2021-08-12: qty 25

## 2021-08-12 NOTE — Progress Notes (Signed)
Patient tolerated IV iron infusion well earlier today.  Patient eating and drinking without nausea, voiding without difficulty,and ambulating in hall.  Denies dizziness.  Pain managed with Tylenol and ibuprofen.  Patient and Dr. Alesia Richards discussed possible discharge this evening, but patient requests to stay until morning.  Phoned Dr. Alesia Richards with above information. Dr. Alesia Richards plans to see patient tomorrow and anticipates discharge home then.

## 2021-08-12 NOTE — Progress Notes (Signed)
1 Day Post-Op Procedure(s) (LRB): ABDOMINAL MYOMECTOMY (N/A)  Subjective: Patient reports {sub:3041132}.    Objective: Blood pressure 129/65, pulse 71, temperature 98.2 F (36.8 C), temperature source Oral, resp. rate 18, height '5\' 4"'$  (1.626 m), weight 90.7 kg, last menstrual period 07/19/2021, SpO2 100 %.     08/12/2021    7:43 AM 08/12/2021    4:29 AM 08/11/2021   11:37 PM  Vitals with BMI  Systolic 761 470 929  Diastolic 65 71 69  Pulse 71 52 81    View Graph  07/16 0700  07/17 0659 07/17 0700  07/18 0659 07/18 0700  07/19 0659  P.O.  300 240  I.V. (mL/kg)  1410.4 (15.6) 1804.1 (19.9)  Total Intake(mL/kg)  1710.4 (18.9) 2044.1 (22.5)  Urine (mL/kg/hr)  1775 300 (0.7)  Blood  50   Total Output  1825 300  Net  -114.6 +1744.1    {Physical VFMB:3403709}  Assessment: s/p Procedure(s): ABDOMINAL MYOMECTOMY (N/A): {assessment details:3041134}  Plan: {UKRC:3818403}  LOS: 1 day    Archie Endo, MD 08/12/2021, 11:23 AM

## 2021-08-13 LAB — SURGICAL PATHOLOGY

## 2021-08-13 MED ORDER — IBUPROFEN 600 MG PO TABS: 600.0000 mg | ORAL_TABLET | Freq: Four times a day (QID) | ORAL | 0 refills | Status: DC

## 2021-08-13 MED ORDER — POLYSACCHARIDE IRON COMPLEX 150 MG PO CAPS
150.0000 mg | ORAL_CAPSULE | Freq: Every day | ORAL | Status: DC
Start: 2021-08-13 — End: 2021-08-13

## 2021-08-13 MED ORDER — POLYSACCHARIDE IRON COMPLEX 150 MG PO CAPS: 150.0000 mg | ORAL_CAPSULE | Freq: Every day | ORAL | 0 refills | Status: DC

## 2021-08-13 MED ORDER — OXYCODONE HCL 5 MG PO TABS: 5.0000 mg | ORAL_TABLET | ORAL | 0 refills | Status: DC | PRN

## 2021-08-13 NOTE — Progress Notes (Signed)
Pt ambulated out teaching complete  MOM with pt  Questions answered

## 2021-08-13 NOTE — Discharge Summary (Signed)
Physician Discharge Summary  Patient ID: Sharon Pratt MRN: [id number] DOB/AGE: 33/33/1992 33 y.o.  Admit date: 08/11/2021 Discharge date: 08/13/2021  Admission Diagnoses: 1. Intramural and subserosal fibroids. 2. Menorrhagia. 3. Anemia.     Discharge Diagnoses:   Intramural and subserosal fibroids. Menorrhagia. Anemia S/p abdominal myomectomy.   Discharged Condition: good  Hospital Course: 31 y/o Para 2 who was admitted for an abdominal myomectomy for symptomatic uterine fibroids.  Her procedure was done without any complications.  By post-operative day 2 she was tolerating a regular diet and passing gas.  Her abdominal and incisional pain was well controlled.  She was ambulating and voiding without difficulty.  She has scant vaginal bleeding.  She received one dose IV iron for  anemia. She was then deemed stable for discharge.   Consults: None  Significant Diagnostic Studies: labs:     Latest Ref Rng & Units 08/12/2021    6:21 AM 08/04/2021    8:43 AM 10/24/2006    5:05 AM  CBC  WBC 4.0 - 10.5 K/uL 16.2  7.7  29.5   Hemoglobin 12.0 - 15.0 g/dL 8.8  10.3  9.1 DELTA CHECK NOTED   Hematocrit 36.0 - 46.0 % 29.1  35.1  26.8   Platelets 150 - 400 K/uL 397  414  229      Treatments: IV hydration, surgery: Abdominal myomectomy and IV iron.   Discharge Exam: Blood pressure 114/65, pulse 70, temperature 98 F (36.7 C), temperature source Oral, resp. rate 18, height '5\' 4"'$  (1.626 m), weight 90.7 kg, last menstrual period 07/19/2021, SpO2 98 %.     08/12/2021    8:04 PM 08/12/2021    3:02 PM 08/12/2021   11:35 AM  Vitals with BMI  Systolic 595 638 756  Diastolic 65 63 60  Pulse 70 81 74    General appearance: alert, cooperative, and no distress Resp: clear to auscultation bilaterally Cardio: regular rate and rhythm, S1, S2 normal, no murmur, click, rub or gallop Extremities: no edema, redness or tenderness in the calves or thighs Incision/Wound: With Honey comb dressing,  clean,dry and intact.  Peripad: small vaginal bleeding.  Disposition:  To home.    Allergies as of 08/13/2021   No Known Allergies      Medication List    You have not been prescribed any medications.     Follow-up Information     Waymon Amato, MD. Go on 08/19/2021.   Specialty: Obstetrics and Gynecology Contact information: [address] [place] [address] [phone]                Allergies as of 08/13/2021   No Known Allergies      Medication List     TAKE these medications    ibuprofen 600 MG tablet Commonly known as: ADVIL Take 1 tablet (600 mg total) by mouth every 6 (six) hours.   iron polysaccharides 150 MG capsule Commonly known as: NIFEREX Take 1 capsule (150 mg total) by mouth daily.   oxyCODONE 5 MG immediate release tablet Commonly known as: Oxy IR/ROXICODONE Take 1-2 tablets (5-10 mg total) by mouth every 4 (four) hours as needed for moderate pain.        Signed: Archie Endo, MD.  08/13/2021, 12:14 AM

## 2021-08-13 NOTE — Progress Notes (Signed)
Pt called and instructed  about duplicate prescription per MD  pt states she only had 1  prescription  filled

## 2021-08-14 LAB — TYPE AND SCREEN
ABO/RH(D): B POS
Antibody Screen: POSITIVE
PT AG Type: NEGATIVE
Unit division: 0
Unit division: 0

## 2021-08-14 LAB — BPAM RBC
Blood Product Expiration Date: 202308092359
Blood Product Expiration Date: 202308092359
Unit Type and Rh: 7300
Unit Type and Rh: 7300

## 2022-02-03 DIAGNOSIS — Z30431 Encounter for routine checking of intrauterine contraceptive device: Secondary | ICD-10-CM | POA: Insufficient documentation

## 2022-02-18 ENCOUNTER — Ambulatory Visit (INDEPENDENT_AMBULATORY_CARE_PROVIDER_SITE_OTHER): Payer: Managed Care, Other (non HMO) | Admitting: Nurse Practitioner

## 2022-02-18 ENCOUNTER — Encounter: Payer: Self-pay | Admitting: Nurse Practitioner

## 2022-02-18 VITALS — BP 116/78 | HR 64 | Ht 64.0 in | Wt 201.8 lb

## 2022-02-18 DIAGNOSIS — E669 Obesity, unspecified: Secondary | ICD-10-CM | POA: Diagnosis not present

## 2022-02-18 DIAGNOSIS — E010 Iodine-deficiency related diffuse (endemic) goiter: Secondary | ICD-10-CM

## 2022-02-18 DIAGNOSIS — Z13220 Encounter for screening for lipoid disorders: Secondary | ICD-10-CM | POA: Diagnosis not present

## 2022-02-18 DIAGNOSIS — Z0000 Encounter for general adult medical examination without abnormal findings: Secondary | ICD-10-CM | POA: Diagnosis not present

## 2022-02-18 DIAGNOSIS — D509 Iron deficiency anemia, unspecified: Secondary | ICD-10-CM | POA: Diagnosis not present

## 2022-02-18 DIAGNOSIS — Z23 Encounter for immunization: Secondary | ICD-10-CM

## 2022-02-18 LAB — CBC WITH DIFFERENTIAL/PLATELET
Basophils Absolute: 0.1 10*3/uL (ref 0.0–0.1)
Basophils Relative: 1 % (ref 0.0–3.0)
Eosinophils Absolute: 0.1 10*3/uL (ref 0.0–0.7)
Eosinophils Relative: 0.6 % (ref 0.0–5.0)
HCT: 39.4 % (ref 36.0–46.0)
Hemoglobin: 12.5 g/dL (ref 12.0–15.0)
Lymphocytes Relative: 16.3 % (ref 12.0–46.0)
Lymphs Abs: 1.3 10*3/uL (ref 0.7–4.0)
MCHC: 31.8 g/dL (ref 30.0–36.0)
MCV: 76 fl — ABNORMAL LOW (ref 78.0–100.0)
Monocytes Absolute: 0.5 10*3/uL (ref 0.1–1.0)
Monocytes Relative: 5.8 % (ref 3.0–12.0)
Neutro Abs: 6.1 10*3/uL (ref 1.4–7.7)
Neutrophils Relative %: 76.3 % (ref 43.0–77.0)
Platelets: 446 10*3/uL — ABNORMAL HIGH (ref 150.0–400.0)
RBC: 5.18 Mil/uL — ABNORMAL HIGH (ref 3.87–5.11)
RDW: 17 % — ABNORMAL HIGH (ref 11.5–15.5)
WBC: 8 10*3/uL (ref 4.0–10.5)

## 2022-02-18 LAB — COMPREHENSIVE METABOLIC PANEL
ALT: 11 U/L (ref 0–35)
AST: 11 U/L (ref 0–37)
Albumin: 4.1 g/dL (ref 3.5–5.2)
Alkaline Phosphatase: 62 U/L (ref 39–117)
BUN: 11 mg/dL (ref 6–23)
CO2: 28 mEq/L (ref 19–32)
Calcium: 9.2 mg/dL (ref 8.4–10.5)
Chloride: 102 mEq/L (ref 96–112)
Creatinine, Ser: 0.8 mg/dL (ref 0.40–1.20)
GFR: 96.53 mL/min (ref >60.00)
Glucose, Bld: 85 mg/dL (ref 70–99)
Potassium: 4.2 mEq/L (ref 3.5–5.1)
Sodium: 139 mEq/L (ref 135–145)
Total Bilirubin: 0.3 mg/dL (ref 0.2–1.2)
Total Protein: 7.3 g/dL (ref 6.0–8.3)

## 2022-02-18 LAB — LIPID PANEL
Cholesterol: 182 mg/dL (ref 0–200)
HDL: 45.9 mg/dL (ref >39.00)
LDL Cholesterol: 120 mg/dL — ABNORMAL HIGH (ref 0–99)
NonHDL: 136.48
Total CHOL/HDL Ratio: 4
Triglycerides: 81 mg/dL (ref 0.0–149.0)
VLDL: 16.2 mg/dL (ref 0.0–40.0)

## 2022-02-18 NOTE — Assessment & Plan Note (Signed)
BMI 34.6. Discussed nutrition, exercise.

## 2022-02-18 NOTE — Assessment & Plan Note (Signed)
Ultrasound from 2017 reviewed and showed enlarged thyroid, however no nodules or masses. Will check thyroid panel and TPO antibodies today.

## 2022-02-18 NOTE — Patient Instructions (Signed)
It was great to see you!  We are checking your labs today and will let you know the results via mychart/phone.   Keep taking claritin or zyrtec and nasal spray.   Let's follow-up in 1 year, sooner if you have concerns.  If a referral was placed today, you will be contacted for an appointment. Please note that routine referrals can sometimes take up to 3-4 weeks to process. Please call our office if you haven't heard anything after this time frame.  Take care,  Vance Peper, NP

## 2022-02-18 NOTE — Assessment & Plan Note (Signed)
She has a history of menorrhagia and multiple fibroids. She had a myomectomy and was taking an iron supplement for 1 month. She also has the mirena IUD inserted. Will check CBC and iron panel today.

## 2022-02-18 NOTE — Progress Notes (Signed)
New Patient Visit  BP 116/78   Pulse 64   Ht '5\' 4"'$  (1.626 m)   Wt 201 lb 12.8 oz (91.5 kg)   SpO2 95%   BMI 34.64 kg/m    Subjective:    Patient ID: Sharon Pratt, female    DOB: Jan 25, 1991, 32 y.o.   MRN: [id number]  CC: Chief Complaint  Patient presents with   New Patient (Initial Visit)    New patient , patient is fasting , est care     HPI: Sharon Pratt is a 32 y.o. female presents for new patient visit to establish care.  Introduced to Designer, jewellery role and practice setting.  All questions answered.  Discussed provider/patient relationship and expectations.  For the last year, she has been experiencing worsening sinus congestion. She alternates claritin and zyrtec along sinex nasal spray. She endorses post nasal drip. She denies fevers and ear pain.   Depression Screen done:      02/18/2022   10:12 AM  Depression screen PHQ 2/9  Decreased Interest 0  Down, Depressed, Hopeless 0  PHQ - 2 Score 0     Past Medical History:  Diagnosis Date   Anemia    Menorrhagia    Molar pregnancy    Thyromegaly     Past Surgical History:  Procedure Laterality Date   CESAREAN SECTION  2012   CESAREAN SECTION  2008   DILATION AND CURETTAGE OF UTERUS  2013   molar pregnancy   MYOMECTOMY N/A 08/11/2021   Procedure: ABDOMINAL MYOMECTOMY;  Surgeon: Waymon Amato, MD;  Location: [facility];  Service: Gynecology;  Laterality: N/A;    Family History  Problem Relation Age of Onset   Healthy Mother    Healthy Father    Diabetes Paternal Aunt    Diabetes Paternal Uncle    Diabetes Maternal Grandmother    Diabetes Paternal Grandmother      Social History   Tobacco Use   Smoking status: Never   Smokeless tobacco: Never  Vaping Use   Vaping Use: Never used  Substance Use Topics   Alcohol use: Yes    Comment: socially   Drug use: Never    Current Outpatient Medications on File Prior to Visit  Medication Sig Dispense Refill   levonorgestrel (MIRENA, 52 MG,) 20 MCG/DAY  IUD Device purchased by [facility]     No current facility-administered medications on file prior to visit.     Review of Systems  Constitutional:  Positive for fatigue. Negative for fever.  HENT:  Positive for congestion and postnasal drip. Negative for ear pain and sore throat.   Eyes: Negative.   Respiratory: Negative.    Cardiovascular: Negative.   Gastrointestinal: Negative.   Genitourinary: Negative.   Musculoskeletal: Negative.   Skin: Negative.   Neurological: Negative.   Psychiatric/Behavioral: Negative.        Objective:    BP 116/78   Pulse 64   Ht '5\' 4"'$  (1.626 m)   Wt 201 lb 12.8 oz (91.5 kg)   SpO2 95%   BMI 34.64 kg/m   Wt Readings from Last 3 Encounters:  02/18/22 201 lb 12.8 oz (91.5 kg)  08/11/21 200 lb (90.7 kg)  10/19/17 165 lb (74.8 kg)    BP Readings from Last 3 Encounters:  02/18/22 116/78  08/13/21 120/71  08/04/21 137/79    Physical Exam Vitals and nursing note reviewed.  Constitutional:      General: She is not in acute distress.    Appearance:  Normal appearance.  HENT:     Head: Normocephalic and atraumatic.     Right Ear: Tympanic membrane, ear canal and external ear normal.     Left Ear: Tympanic membrane, ear canal and external ear normal.  Eyes:     Conjunctiva/sclera: Conjunctivae normal.  Neck:     Thyroid: Thyromegaly present.  Cardiovascular:     Rate and Rhythm: Normal rate and regular rhythm.     Pulses: Normal pulses.     Heart sounds: Normal heart sounds.  Pulmonary:     Effort: Pulmonary effort is normal.     Breath sounds: Normal breath sounds.  Abdominal:     Palpations: Abdomen is soft.     Tenderness: There is no abdominal tenderness.  Musculoskeletal:        General: Normal range of motion.     Cervical back: Normal range of motion and neck supple.     Right lower leg: No edema.     Left lower leg: No edema.  Lymphadenopathy:     Cervical: No cervical adenopathy.  Skin:    General: Skin is warm and dry.   Neurological:     General: No focal deficit present.     Mental Status: She is alert and oriented to person, place, and time.     Cranial Nerves: No cranial nerve deficit.     Coordination: Coordination normal.     Gait: Gait normal.  Psychiatric:        Mood and Affect: Mood normal.        Behavior: Behavior normal.        Thought Content: Thought content normal.        Judgment: Judgment normal.        Assessment & Plan:   Problem List Items Addressed This Visit       Endocrine   Thyromegaly    Ultrasound from 2017 reviewed and showed enlarged thyroid, however no nodules or masses. Will check thyroid panel and TPO antibodies today.       Relevant Orders   Thyroid Peroxidase Antibodies (TPO) (REFL)   Thyroid Panel With TSH     Other   Iron deficiency anemia    She has a history of menorrhagia and multiple fibroids. She had a myomectomy and was taking an iron supplement for 1 month. She also has the mirena IUD inserted. Will check CBC and iron panel today.       Relevant Orders   CBC with Differential/Platelet   Iron, TIBC and Ferritin Panel   Obesity (BMI 30-39.9)    BMI 34.6. Discussed nutrition, exercise.       Other Visit Diagnoses     Routine general medical examination at a health care facility    -  Primary   Health maintenance reviewed and updated. Discussed nutrition, exercise. Check CMP, CBC today. Follow-up 1 year   Relevant Orders   CBC with Differential/Platelet   Comprehensive metabolic panel   Screening, lipid       Screen baseline lipid today   Relevant Orders   Lipid panel   Need for Tdap vaccination       Tdap given today   Relevant Orders   Tdap vaccine greater than or equal to 7yo IM (Completed)       LABORATORY TESTING:  - Pap smear: up to date  IMMUNIZATIONS:   - Tdap: Tetanus vaccination status reviewed: Td vaccination indicated and given today. - Influenza: Up to date - Pneumovax: Not applicable -  Prevnar: Not applicable -  HPV: Not applicable - Zostavax vaccine: Not applicable  SCREENING: -Mammogram: Not applicable  - Colonoscopy: Not applicable  - Bone Density: Not applicable  -Hearing Test: Not applicable  -Spirometry: Not applicable   PATIENT COUNSELING:   Advised to take 1 mg of folate supplement per day if capable of pregnancy.   Sexuality: Discussed sexually transmitted diseases, partner selection, use of condoms, avoidance of unintended pregnancy  and contraceptive alternatives.   Advised to avoid cigarette smoking.  I discussed with the patient that most people either abstain from alcohol or drink within safe limits (<=14/week and <=4 drinks/occasion for males, <=7/weeks and <= 3 drinks/occasion for females) and that the risk for alcohol disorders and other health effects rises proportionally with the number of drinks per week and how often a drinker exceeds daily limits.  Discussed cessation/primary prevention of drug use and availability of treatment for abuse.   Diet: Encouraged to adjust caloric intake to maintain  or achieve ideal body weight, to reduce intake of dietary saturated fat and total fat, to limit sodium intake by avoiding high sodium foods and not adding table salt, and to maintain adequate dietary potassium and calcium preferably from fresh fruits, vegetables, and low-fat dairy products.    stressed the importance of regular exercise  Injury prevention: Discussed safety belts, safety helmets, smoke detector, smoking near bedding or upholstery.   Dental health: Discussed importance of regular tooth brushing, flossing, and dental visits.    NEXT PREVENTATIVE PHYSICAL DUE IN 1 YEAR.   Follow up plan: Return in about 1 year (around 02/19/2023) for CPE.

## 2022-02-20 LAB — THYROID PEROXIDASE ANTIBODIES (TPO) (REFL): Thyroperoxidase Ab SerPl-aCnc: 1 IU/mL (ref <9)

## 2022-02-20 LAB — IRON,TIBC AND FERRITIN PANEL
%SAT: 9 % (calc) — ABNORMAL LOW (ref 16–45)
Ferritin: 54 ng/mL (ref 16–154)
Iron: 31 ug/dL — ABNORMAL LOW (ref 40–190)
TIBC: 358 mcg/dL (calc) (ref 250–450)

## 2022-02-20 LAB — THYROID PANEL WITH TSH
Free Thyroxine Index: 1.9 (ref 1.4–3.8)
T3 Uptake: 26 % (ref 22–35)
T4, Total: 7.3 ug/dL (ref 5.1–11.9)
TSH: 1.11 mIU/L

## 2023-02-24 ENCOUNTER — Encounter: Payer: Managed Care, Other (non HMO) | Admitting: Nurse Practitioner

## 2023-03-31 ENCOUNTER — Ambulatory Visit: Payer: Managed Care, Other (non HMO) | Admitting: Nurse Practitioner

## 2023-12-10 ENCOUNTER — Encounter: Payer: Self-pay | Admitting: Emergency Medicine

## 2023-12-10 ENCOUNTER — Ambulatory Visit: Admission: EM | Admit: 2023-12-10 | Discharge: 2023-12-10 | Disposition: A | Discharge status: Home / Self Care

## 2023-12-10 DIAGNOSIS — R32 Unspecified urinary incontinence: Secondary | ICD-10-CM | POA: Insufficient documentation

## 2023-12-10 DIAGNOSIS — J020 Streptococcal pharyngitis: Secondary | ICD-10-CM | POA: Diagnosis not present

## 2023-12-10 DIAGNOSIS — T7491XA Unspecified adult maltreatment, confirmed, initial encounter: Secondary | ICD-10-CM | POA: Insufficient documentation

## 2023-12-10 DIAGNOSIS — Z7189 Other specified counseling: Secondary | ICD-10-CM | POA: Insufficient documentation

## 2023-12-10 DIAGNOSIS — J309 Allergic rhinitis, unspecified: Secondary | ICD-10-CM | POA: Insufficient documentation

## 2023-12-10 DIAGNOSIS — T7840XA Allergy, unspecified, initial encounter: Secondary | ICD-10-CM | POA: Insufficient documentation

## 2023-12-10 DIAGNOSIS — J029 Acute pharyngitis, unspecified: Secondary | ICD-10-CM | POA: Diagnosis present

## 2023-12-10 DIAGNOSIS — T7411XA Adult physical abuse, confirmed, initial encounter: Secondary | ICD-10-CM | POA: Insufficient documentation

## 2023-12-10 LAB — POC COVID19/FLU A&B COMBO
Covid Antigen, POC: NEGATIVE
Influenza A Antigen, POC: NEGATIVE
Influenza B Antigen, POC: NEGATIVE

## 2023-12-10 LAB — POCT RAPID STREP A (OFFICE): Rapid Strep A Screen: NEGATIVE

## 2023-12-10 MED ORDER — ACETAMINOPHEN 325 MG PO TABS
650.0000 mg | ORAL_TABLET | Freq: Once | ORAL | Status: AC
Start: 2023-12-10 — End: 2023-12-10
  Administered 2023-12-10: 650 mg via ORAL

## 2023-12-10 MED ORDER — AMOXICILLIN 500 MG PO CAPS: 500.0000 mg | ORAL_CAPSULE | Freq: Two times a day (BID) | ORAL | 0 refills | Status: AC

## 2023-12-10 NOTE — ED Triage Notes (Signed)
 Pt reports sore throat, chills, fevers, and body aches that started this morning. Denies diarrhea, nausea, vomiting, or abdominal pain. No med use for symptoms.

## 2023-12-10 NOTE — Discharge Instructions (Signed)
 You have been diagnosed with strep throat today and prescribed an antibiotic.  You will be contagious until you have completed 24 hours of antibiotics.  You should discard your toothbrush in 24 hours as well.  You may use ibuprofen  and/or Tylenol  as needed for pain and fever.  Chloraseptic and Cepacol make a numbing throat lozenges that can be obtained over-the-counter that is helpful for throat pain as well.
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
## 2023-12-12 LAB — CULTURE, GROUP A STREP (THRC)

## 2023-12-13 ENCOUNTER — Ambulatory Visit (HOSPITAL_COMMUNITY): Payer: Self-pay
